# Patient Record
Sex: Female | Born: 1935 | Race: White | Hispanic: No | Smoking: Never smoker
Health system: Southern US, Community
[De-identification: ages and names within clinical notes are randomized; demographics above are authoritative.]

## PROBLEM LIST (undated history)

## (undated) DIAGNOSIS — H5440 Blindness, one eye, unspecified eye: Secondary | ICD-10-CM

## (undated) DIAGNOSIS — S72002A Fracture of unspecified part of neck of left femur, initial encounter for closed fracture: Secondary | ICD-10-CM

## (undated) DIAGNOSIS — N289 Disorder of kidney and ureter, unspecified: Secondary | ICD-10-CM

## (undated) DIAGNOSIS — E079 Disorder of thyroid, unspecified: Secondary | ICD-10-CM

## (undated) HISTORY — DX: Disorder of thyroid, unspecified: E07.9

---
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
## 2004-05-21 ENCOUNTER — Ambulatory Visit: Payer: Self-pay | Admitting: Family Medicine

## 2004-05-28 ENCOUNTER — Ambulatory Visit: Payer: Self-pay | Admitting: Family Medicine

## 2004-09-17 ENCOUNTER — Ambulatory Visit: Payer: Self-pay | Admitting: Family Medicine

## 2004-10-16 ENCOUNTER — Ambulatory Visit: Payer: Self-pay | Admitting: Family Medicine

## 2004-11-06 ENCOUNTER — Ambulatory Visit: Payer: Self-pay | Admitting: Cardiology

## 2005-03-04 ENCOUNTER — Ambulatory Visit: Payer: Self-pay | Admitting: Family Medicine

## 2005-03-11 ENCOUNTER — Ambulatory Visit: Payer: Self-pay | Admitting: Cardiology

## 2005-03-11 ENCOUNTER — Inpatient Hospital Stay (HOSPITAL_COMMUNITY): Admission: EM | Admit: 2005-03-11 | Discharge: 2005-03-12 | Payer: Self-pay | Admitting: Emergency Medicine

## 2005-03-11 ENCOUNTER — Encounter (INDEPENDENT_AMBULATORY_CARE_PROVIDER_SITE_OTHER): Payer: Self-pay | Admitting: *Deleted

## 2005-03-11 ENCOUNTER — Ambulatory Visit: Payer: Self-pay | Admitting: Family Medicine

## 2005-03-19 ENCOUNTER — Ambulatory Visit: Payer: Self-pay

## 2005-03-19 ENCOUNTER — Ambulatory Visit: Payer: Self-pay | Admitting: Internal Medicine

## 2005-04-03 ENCOUNTER — Ambulatory Visit: Payer: Self-pay | Admitting: *Deleted

## 2005-04-05 ENCOUNTER — Ambulatory Visit: Payer: Self-pay | Admitting: Critical Care Medicine

## 2005-07-29 ENCOUNTER — Ambulatory Visit: Payer: Self-pay | Admitting: Family Medicine

## 2005-08-06 ENCOUNTER — Ambulatory Visit: Payer: Self-pay | Admitting: Cardiology

## 2005-12-11 ENCOUNTER — Ambulatory Visit: Payer: Self-pay | Admitting: Family Medicine

## 2006-06-02 ENCOUNTER — Ambulatory Visit: Payer: Self-pay | Admitting: Family Medicine

## 2006-09-16 ENCOUNTER — Ambulatory Visit: Payer: Self-pay | Admitting: Family Medicine

## 2007-10-23 ENCOUNTER — Ambulatory Visit: Payer: Self-pay | Admitting: Cardiology

## 2010-04-15 ENCOUNTER — Encounter: Payer: Self-pay | Admitting: Critical Care Medicine

## 2010-08-10 NOTE — H&P (Signed)
[name], Fitzpatrick               ACCOUNT NO.:  [id number]   MEDICAL RECORD NO.:  [id number]          PATIENT TYPE:  INP   LOCATION:  1824                         FACILITY:  [facility]   PHYSICIAN:  Brandy Fitzpatrick, M.D.   DATE OF BIRTH:  [birth year]   DATE OF ADMISSION:  03/11/2005  DATE OF DISCHARGE:                                HISTORY & PHYSICAL   PRIMARY CARDIOLOGIST:  Brandy Fitzpatrick, M.D. ([place])   REASON FOR ADMISSION:  Brandy Fitzpatrick is a delightful 77 year old female, with  no prior cardiac history, now referred directly from her primary care  physician's office for evaluation of new onset chest pain.   The patient awoke at 5:30 this morning, as per usual, and has recently been  battling a sinus infection and, in fact, has been treated with steroids and  is currently on a 10-day course of antibiotics.  She felt quite congested  this morning and, after forcefully coughing up some phlegm, developed a  lower sternal chest discomfort which radiated to the mid scapular region.  She had some mild shortness of breath but no associated diaphoresis, nausea,  or radiation to the jaw or upper extremities.   After taking her morning medications, she presented to her primary care  physician for further evaluation.  Electrocardiogram suggested prior  inferior MI with an isolated PVC.  The patient was treated with aspirin and  transported here via EMS.   The patient reports exacerbation and other lower sternum discomfort with  either inspiration or palpation.  She otherwise denies any antecedent  history of exertional chest discomfort.  In fact, she is quite active and,  until recently, would rake her leaves without any associated chest  discomfort.  She denies any recent change in her baseline level of exercise  activity.   Electrocardiogram on admission reveals normal sinus rhythm at 62 beats per  minute with normal axis and insignificant Q waves in the inferolateral  leads.   ALLERGIES:   SULFA, CODEINE, and DARVOCET.   HOME MEDICATIONS:  1.  Synthroid 0.125 mg daily.  2.  Lanoxin 0.125 mg daily (since 1976).  3.  Amoxicillin 500 mg 3 times a day (day 8/10).  4.  Status post 5-day course of prednisone.   PAST MEDICAL HISTORY:  1.  Longstanding history of murmur.  2.  Treated hypothyroidism.  3.  Bilateral cataract surgery.   SOCIAL HISTORY:  The patient [place] in [place].  She lives alone and is  quite self sufficient, taking her own medications.  She freely attends to  her home and yard without the assistance of a walker or crutches.  She  worked at least 40 years in a [employer], [employer].  She has never smoked  tobacco and denies alcohol use.   FAMILY HISTORY:  Mother deceased at age 33 secondary to myocardial  infarction, history of stroke.  Father deceased age 75 from complications of  a stroke, history of prior MIs.  Brother deceased at age 18 from  complications of emphysema, history of MI.   REVIEW OF SYSTEMS:  Denies exertional chest pain.  Has mild exertional  dyspnea but no recent exacerbation.  Denies orthopnea, PND, lower extremity  edema, or symptoms of palpitations.  Denies any recent evidence of  hematuria, hematochezia, or melena.  Denies recent fever, chills, or sweats,  but has been battling a sinus infection and is currently on antibiotics.  She has diminished hearing on the right and does not wear an aid.  She has  no reflux symptoms.  She denies any prior history of myocardial infarction,  congestive heart failure, syncope, or stroke.  Remaining Review of Systems  negative.   PHYSICAL EXAMINATION:  VITAL SIGNS:  Blood pressure 115/72, pulse 58,  respiratory rate 20, temperature 98.2, O2 saturation 97% on room air.  GENERAL:  An 77 year old female in no apparent distress.  HEENT:  Normocephalic and atraumatic.  NECK:  Preserved carotid pulses without bruits; no JVD.  LUNGS:  Clear to auscultation in all fields.  HEART:  Regular rate and  rhythm (S1 and S2), a 3/6 holosystolic murmur heard  loudest in the left pre-axillary line (question __________), palpable apical  pulse, significant tenderness with sternal palpation.  ABDOMEN:  Soft, nontender, with intact bowel sounds.  EXTREMITIES:  Preserved bilateral femoral and distal pulses with no  significant pedal edema.   Admission chest x-ray: Cardiomegaly, COPD, no acute disease.   Electrocardiogram: Normal sinus rhythm at 62 beats per minute, normal axis;  no acute changes; significant Q wave in inferolateral leads.   LABORATORY DATA:  CPK 32/2.  Troponin I 0.02.  Digoxin 1.  Sodium 140,  potassium 4.4, BUN 9, creatinine 0.7, glucose 104.  Hemoglobin 14.7,  hematocrit 42.6, platelets 190, WBC 9.6 with elevated neutrophils at 83%.   IMPRESSION:  1.  Atypical chest pain.  Chest discomfort precipitated by vigorous coughing      and exacerbated by expiration or palpation of the xiphoid.  2.  Systolic murmur, suspect moderate mitral regurgitation; question mitral      valve prolapse.  3.  Treated hypothyroidism.  4.  Recent sinusitis treated with prednisone and currently on antibiotics.   PLAN:  1.  The patient will be admitted to telemetry for close monitoring and      further evaluation.  2.  Cardiac markers will be cycled to rule out an ischemic etiology,      although this appears unlikely by history and examination.  3.  Additionally, we will check a D-dimer to exclude pulmonary embolus as      the etiology for the pleuritic pain.  4.  A 2-D echocardiogram has been ordered for assessment of left ventricular      function and severity of the mitral regurgitation as well as exclusion      of any significant pericardial effusion.  5.  We will also check a fasting lipid profile, TSH, and hemoglobin A1c.  6.  If patient rules out for MI, plan is to proceed with outpatient stress     perfusion imaging for risk stratification.  7.  The patient's home medications will be  continued, and we will add      aspirin as well.      Brandy Fitzpatrick, P.A. [facility]      Brandy Fitzpatrick, M.D.  Electronically Signed    GS/MEDQ  D:  03/11/2005  T:  03/11/2005  Job:  [id number]   cc:   Delaney Meigs, M.D.  Fax: [phone]

## 2010-08-10 NOTE — Discharge Summary (Signed)
Brandy Fitzpatrick, Brandy Fitzpatrick               ACCOUNT NO.:  [id number]   MEDICAL RECORD NO.:  [id number]          PATIENT TYPE:  INP   LOCATION:  3733                         FACILITY:  [facility]   PHYSICIAN:  Charlton Haws, M.D.     DATE OF BIRTH:  03-14-1920   DATE OF ADMISSION:  03/11/2005  DATE OF DISCHARGE:  03/12/2005                                 DISCHARGE SUMMARY   PROCEDURE:  2-D echocardiogram.   PRINCIPAL DIAGNOSES:  1.  Atypical chest pain.      1.  Most likely musculoskeletal; normal serial cardiac markers.  2.  Valvular heart disease.      1.  Moderate mitral regurgitation/mild to moderate aortic regurgitation;          moderate MVP.  3.  Normal left ventricular function.  4.  Mildly elevated D-dimer.   SECONDARY DIAGNOSES:  1.  Treated hypothyroidism.  2.  Recent sinusitis.   REASON FOR ADMISSION:  Brandy Fitzpatrick is an 77 year old female with no prior  cardiac history, who was referred from her primary care physician's office  for further evaluation of new onset chest discomfort which was precipitated  by vigorous coughing. The patient is currently on antibiotics for recent  sinus infection. She presents with no prior history of exertional chest  pain.   LABORATORY DATA:  WBC of 9.6, hemoglobin 14.7, hematocrit 42.6, platelet  count 190,000. Neutrophils 83%, INR 1.1. D-dimer 0.58. Potassium 4.4,  glucose 104, BUN 9, creatinine 0.7, normal liver enzymes. Hemoglobin A1c of  5.8. Normal cardiac markers. TSH 0.97. Digoxin level 1.0.   ADMISSION CHEST X-RAY:  Cardiomegaly; COPD; no acute changes.   HOSPITAL COURSE:  The patient was admitted for overnight observation, rule  out of myocardial infarction. All serial cardiac markers were within normal  limits.   Additionally, a 2-D echocardiogram was ordered for assessment of LV function  and severity of valvular heart disease, by physical examination.  Echocardiogram revealed normal left ventricular function (EF of 55% to 65%)  with mild to moderate aortic regurgitation and moderate mitral regurgitation  with moderate MVP involving the posterior leaflet.   Of note, a D-dimer was ordered, although pulmonary embolus was thought to be  of low probability and this was mildly elevated at 0.58. Fasting lipids  pending.   The patient was cleared for discharge the following morning, with  instructions to proceed with further outpatient testing consisting of an  adenosine stress Myoview for risk stratification and a chest CT scan to rule  out pulmonary embolus.   The patient was instructed to use ibuprofen p.r.n. for her discomfort.   MEDICATIONS AT DISCHARGE:  1.  Coated aspirin 81 mg daily.  2.  Synthroid 0.125 mg daily.  3.  Lanoxin 0.125 mg daily.  4.  Ibuprofen 200 mg one to two tablets q.6h. p.r.n.   INSTRUCTIONS:  1.  Followup chest CT scan on Tuesday, December 26th at 10:30 a.m., rule out      pulmonary embolus.  2.  Followup adenosine stress Myoview on Tuesday, December 26th at 12 noon.  3.  Followup with Dr. Zenovia Jarred  clinic on January 10th at 10:45 a.m.   DISCHARGE DURATION:  38 minutes, including physician time.      Gene Serpe, P.A. [facility]    ______________________________  Charlton Haws, M.D.    GS/MEDQ  D:  03/12/2005  T:  03/13/2005  Job:  [id number]   cc:   Delaney Meigs, M.D.  Fax: [phone]

## 2015-03-26 DIAGNOSIS — H5440 Blindness, one eye, unspecified eye: Secondary | ICD-10-CM

## 2015-03-26 HISTORY — PX: JOINT REPLACEMENT: SHX530

## 2015-03-26 HISTORY — DX: Blindness, one eye, unspecified eye: H54.40

## 2015-05-16 DIAGNOSIS — E039 Hypothyroidism, unspecified: Secondary | ICD-10-CM | POA: Insufficient documentation

## 2015-05-16 DIAGNOSIS — S72002A Fracture of unspecified part of neck of left femur, initial encounter for closed fracture: Secondary | ICD-10-CM

## 2015-05-16 HISTORY — DX: Fracture of unspecified part of neck of left femur, initial encounter for closed fracture: S72.002A

## 2015-05-18 DIAGNOSIS — E538 Deficiency of other specified B group vitamins: Secondary | ICD-10-CM | POA: Insufficient documentation

## 2015-05-21 DIAGNOSIS — I340 Nonrheumatic mitral (valve) insufficiency: Secondary | ICD-10-CM | POA: Insufficient documentation

## 2015-08-25 ENCOUNTER — Encounter (INDEPENDENT_AMBULATORY_CARE_PROVIDER_SITE_OTHER): Payer: Medicare Other | Admitting: Ophthalmology

## 2015-08-25 DIAGNOSIS — H353122 Nonexudative age-related macular degeneration, left eye, intermediate dry stage: Secondary | ICD-10-CM

## 2015-08-25 DIAGNOSIS — I10 Essential (primary) hypertension: Secondary | ICD-10-CM

## 2015-08-25 DIAGNOSIS — H35033 Hypertensive retinopathy, bilateral: Secondary | ICD-10-CM

## 2015-08-25 DIAGNOSIS — H34811 Central retinal vein occlusion, right eye, with macular edema: Secondary | ICD-10-CM

## 2015-08-25 DIAGNOSIS — H43813 Vitreous degeneration, bilateral: Secondary | ICD-10-CM

## 2015-09-22 ENCOUNTER — Encounter (INDEPENDENT_AMBULATORY_CARE_PROVIDER_SITE_OTHER): Payer: Medicare Other | Admitting: Ophthalmology

## 2015-09-22 DIAGNOSIS — H35033 Hypertensive retinopathy, bilateral: Secondary | ICD-10-CM

## 2015-09-22 DIAGNOSIS — H34811 Central retinal vein occlusion, right eye, with macular edema: Secondary | ICD-10-CM

## 2015-09-22 DIAGNOSIS — H353122 Nonexudative age-related macular degeneration, left eye, intermediate dry stage: Secondary | ICD-10-CM

## 2015-09-22 DIAGNOSIS — H43813 Vitreous degeneration, bilateral: Secondary | ICD-10-CM

## 2015-09-22 DIAGNOSIS — I10 Essential (primary) hypertension: Secondary | ICD-10-CM

## 2015-10-20 ENCOUNTER — Encounter (INDEPENDENT_AMBULATORY_CARE_PROVIDER_SITE_OTHER): Payer: Medicare Other | Admitting: Ophthalmology

## 2015-10-20 DIAGNOSIS — H34811 Central retinal vein occlusion, right eye, with macular edema: Secondary | ICD-10-CM | POA: Diagnosis not present

## 2015-10-20 DIAGNOSIS — H353122 Nonexudative age-related macular degeneration, left eye, intermediate dry stage: Secondary | ICD-10-CM

## 2015-10-20 DIAGNOSIS — I10 Essential (primary) hypertension: Secondary | ICD-10-CM

## 2015-10-20 DIAGNOSIS — H43813 Vitreous degeneration, bilateral: Secondary | ICD-10-CM | POA: Diagnosis not present

## 2015-10-20 DIAGNOSIS — H35033 Hypertensive retinopathy, bilateral: Secondary | ICD-10-CM | POA: Diagnosis not present

## 2015-10-20 DIAGNOSIS — H35372 Puckering of macula, left eye: Secondary | ICD-10-CM

## 2015-11-20 ENCOUNTER — Encounter (INDEPENDENT_AMBULATORY_CARE_PROVIDER_SITE_OTHER): Payer: Medicare Other | Admitting: Ophthalmology

## 2015-11-20 DIAGNOSIS — H34811 Central retinal vein occlusion, right eye, with macular edema: Secondary | ICD-10-CM

## 2015-11-20 DIAGNOSIS — H353122 Nonexudative age-related macular degeneration, left eye, intermediate dry stage: Secondary | ICD-10-CM | POA: Diagnosis not present

## 2015-11-20 DIAGNOSIS — H43813 Vitreous degeneration, bilateral: Secondary | ICD-10-CM

## 2015-11-20 DIAGNOSIS — H35033 Hypertensive retinopathy, bilateral: Secondary | ICD-10-CM

## 2015-11-20 DIAGNOSIS — I10 Essential (primary) hypertension: Secondary | ICD-10-CM

## 2015-12-18 ENCOUNTER — Encounter (INDEPENDENT_AMBULATORY_CARE_PROVIDER_SITE_OTHER): Payer: Medicare Other | Admitting: Ophthalmology

## 2015-12-18 DIAGNOSIS — I10 Essential (primary) hypertension: Secondary | ICD-10-CM

## 2015-12-18 DIAGNOSIS — H43813 Vitreous degeneration, bilateral: Secondary | ICD-10-CM | POA: Diagnosis not present

## 2015-12-18 DIAGNOSIS — H353122 Nonexudative age-related macular degeneration, left eye, intermediate dry stage: Secondary | ICD-10-CM | POA: Diagnosis not present

## 2015-12-18 DIAGNOSIS — H34811 Central retinal vein occlusion, right eye, with macular edema: Secondary | ICD-10-CM

## 2015-12-18 DIAGNOSIS — H35033 Hypertensive retinopathy, bilateral: Secondary | ICD-10-CM | POA: Diagnosis not present

## 2016-06-17 ENCOUNTER — Ambulatory Visit (INDEPENDENT_AMBULATORY_CARE_PROVIDER_SITE_OTHER): Payer: Medicare Other | Admitting: Ophthalmology

## 2018-10-05 ENCOUNTER — Other Ambulatory Visit: Payer: Self-pay

## 2018-10-08 ENCOUNTER — Other Ambulatory Visit: Payer: Self-pay

## 2018-10-08 ENCOUNTER — Ambulatory Visit (INDEPENDENT_AMBULATORY_CARE_PROVIDER_SITE_OTHER): Payer: Medicare Other | Admitting: Family Medicine

## 2018-10-08 ENCOUNTER — Encounter: Payer: Self-pay | Admitting: Family Medicine

## 2018-10-08 VITALS — BP 134/79 | HR 72 | Temp 99.1°F | Ht 60.0 in | Wt 113.0 lb

## 2018-10-08 DIAGNOSIS — E039 Hypothyroidism, unspecified: Secondary | ICD-10-CM

## 2018-10-08 DIAGNOSIS — I340 Nonrheumatic mitral (valve) insufficiency: Secondary | ICD-10-CM

## 2018-10-08 DIAGNOSIS — Z7689 Persons encountering health services in other specified circumstances: Secondary | ICD-10-CM | POA: Diagnosis not present

## 2018-10-08 MED ORDER — LEVOTHYROXINE SODIUM 112 MCG PO TABS: 112.0000 ug | ORAL_TABLET | Freq: Every day | ORAL | 1 refills | Status: DC

## 2018-10-08 MED ORDER — DIGOXIN 125 MCG PO TABS: 0.1250 mg | ORAL_TABLET | Freq: Every day | ORAL | 1 refills | Status: DC

## 2018-10-08 NOTE — Progress Notes (Signed)
New Patient Office Visit  Subjective:  Patient ID: Brandy Fitzpatrick, female    DOB: [birth year]  Age: 85 y.o. MRN: [id number]  CC:  Chief Complaint  Patient presents with  . Establish Care    HPI Brandy Fitzpatrick presents to establish care and for medication refills. She is accompanied by her daughter who she is okay with being present. She is transferring from Dr. Murrell Redden office as they have recently closed due to his retirement.   Patient has no complaints today.  Review of Systems  Constitutional: Negative for chills, fever, malaise/fatigue and weight loss.  HENT: Positive for hearing loss. Negative for congestion, ear discharge, ear pain, nosebleeds, sinus pain, sore throat and tinnitus.   Eyes: Negative for blurred vision, double vision, pain, discharge and redness.       Blind in right eye.  Respiratory: Negative for cough, shortness of breath and wheezing.   Cardiovascular: Negative for chest pain, palpitations and leg swelling.  Gastrointestinal: Negative for abdominal pain, constipation, diarrhea, heartburn, nausea and vomiting.  Genitourinary: Negative for dysuria, frequency and urgency.  Musculoskeletal: Negative for myalgias.  Skin: Negative for rash.  Neurological: Negative for dizziness, seizures, weakness and headaches.  Psychiatric/Behavioral: Negative for depression, substance abuse and suicidal ideas. The patient is not nervous/anxious.     Current Outpatient Medications:  .  aspirin 81 MG chewable tablet, Chew by mouth daily., Disp: , Rfl:  .  digoxin (LANOXIN) 0.125 MG tablet, Take 1 tablet (0.125 mg total) by mouth daily., Disp: 90 tablet, Rfl: 1 .  levothyroxine (SYNTHROID) 112 MCG tablet, Take 1 tablet (112 mcg total) by mouth daily before breakfast., Disp: 90 tablet, Rfl: 1  Allergies  Allergen Reactions  . Codeine Rash  . Propoxyphene Rash  . Sulfa Antibiotics Rash    Past Medical History:  Diagnosis Date  . Blind right eye 2017   possible  stroke in eye; nerves burned so it would no longer cause pain  . Fracture of unspecified part of neck of left femur, initial encounter for closed fracture ([place]) 05/16/2015  . Thyroid disease     Past Surgical History:  Procedure Laterality Date  . JOINT REPLACEMENT  2017   hip    Family History  Problem Relation Age of Onset  . Stroke Mother   . Heart disease Mother   . Stroke Father   . Heart disease Father   . Parkinson's disease Sister   . Dementia Sister   . Stroke Sister   . Heart attack Brother     Social History   Socioeconomic History  . Marital status: Widowed    Spouse name: Not on file  . Number of children: Not on file  . Years of education: Not on file  . Highest education level: Not on file  Occupational History  . Not on file  Social Needs  . Financial resource strain: Not on file  . Food insecurity    Worry: Not on file    Inability: Not on file  . Transportation needs    Medical: Not on file    Non-medical: Not on file  Tobacco Use  . Smoking status: Never Smoker  . Smokeless tobacco: Never Used  Substance and Sexual Activity  . Alcohol use: Never    Frequency: Never  . Drug use: Never  . Sexual activity: Not Currently  Lifestyle  . Physical activity    Days per week: Not on file    Minutes per session: Not on file  .  Stress: Not on file  Relationships  . Social Herbalist on phone: Not on file    Gets together: Not on file    Attends religious service: Not on file    Active member of club or organization: Not on file    Attends meetings of clubs or organizations: Not on file    Relationship status: Not on file  . Intimate partner violence    Fear of current or ex partner: Not on file    Emotionally abused: Not on file    Physically abused: Not on file    Forced sexual activity: Not on file  Other Topics Concern  . Not on file  Social History Narrative  . Not on file    Objective:   Today's Vitals: BP 134/79   Pulse  72   Temp 99.1 F (37.3 C) (Oral)   Ht 5' (1.524 m)   Wt 113 lb (51.3 kg)   BMI 22.07 kg/m   Physical Exam Vitals signs reviewed.  Constitutional:      General: She is not in acute distress.    Appearance: Normal appearance. She is normal weight. She is not ill-appearing, toxic-appearing or diaphoretic.  HENT:     Head: Normocephalic and atraumatic.     Right Ear: Tympanic membrane, ear canal and external ear normal. There is no impacted cerumen.     Left Ear: Tympanic membrane, ear canal and external ear normal. There is no impacted cerumen.     Nose: Nose normal. No congestion or rhinorrhea.     Mouth/Throat:     Mouth: Mucous membranes are moist.     Pharynx: Oropharynx is clear. No oropharyngeal exudate or posterior oropharyngeal erythema.  Eyes:     General: No scleral icterus.       Right eye: No discharge.        Left eye: No discharge.     Conjunctiva/sclera: Conjunctivae normal.     Pupils: Pupils are equal, round, and reactive to light.  Neck:     Musculoskeletal: Normal range of motion and neck supple. No neck rigidity or muscular tenderness.  Cardiovascular:     Rate and Rhythm: Normal rate and regular rhythm.     Heart sounds: Murmur present. Systolic murmur present with a grade of 1/6. No friction rub. No gallop.   Pulmonary:     Effort: Pulmonary effort is normal. No respiratory distress.     Breath sounds: Normal breath sounds. No stridor. No wheezing, rhonchi or rales.  Abdominal:     General: Abdomen is flat. Bowel sounds are normal. There is no distension.     Palpations: Abdomen is soft. There is no mass.     Tenderness: There is no abdominal tenderness. There is no guarding or rebound.     Hernia: No hernia is present.  Musculoskeletal: Normal range of motion.  Lymphadenopathy:     Cervical: No cervical adenopathy.  Skin:    General: Skin is warm and dry.     Capillary Refill: Capillary refill takes less than 2 seconds.  Neurological:     General:  No focal deficit present.     Mental Status: She is alert and oriented to person, place, and time. Mental status is at baseline.     Gait: Gait abnormal (ambulates with cane).  Psychiatric:        Mood and Affect: Mood normal.        Behavior: Behavior normal.  Thought Content: Thought content normal.        Judgment: Judgment normal.     Assessment & Plan:   1. Acquired hypothyroidism - Well controlled on current regimen. - levothyroxine (SYNTHROID) 112 MCG tablet; Take 1 tablet (112 mcg total) by mouth daily before breakfast.  Dispense: 90 tablet; Refill: 1  2. Non-rheumatic mitral regurgitation - Controlled on current regimen.  - digoxin (LANOXIN) 0.125 MG tablet; Take 1 tablet (0.125 mg total) by mouth daily.  Dispense: 90 tablet; Refill: 1  3. Encounter to establish care - DEXA results requested from [facility].    Follow-up: Return in about 4 months (around 02/08/2019) for follow-up of chronic medication conditions.   Loman Brooklyn, FNP

## 2018-10-08 NOTE — Patient Instructions (Signed)
Preventive Care 83 Years and Older, Female Preventive care refers to lifestyle choices and visits with your health care provider that can promote health and wellness. This includes:  A yearly physical exam. This is also called an annual well check.  Regular dental and eye exams.  Immunizations.  Screening for certain conditions.  Healthy lifestyle choices, such as diet and exercise. What can I expect for my preventive care visit? Physical exam Your health care provider will check:  Height and weight. These may be used to calculate body mass index (BMI), which is a measurement that tells if you are at a healthy weight.  Heart rate and blood pressure.  Your skin for abnormal spots. Counseling Your health care provider may ask you questions about:  Alcohol, tobacco, and drug use.  Emotional well-being.  Home and relationship well-being.  Sexual activity.  Eating habits.  History of falls.  Memory and ability to understand (cognition).  Work and work Statistician.  Pregnancy and menstrual history. What immunizations do I need?  Influenza (flu) vaccine  This is recommended every year. Tetanus, diphtheria, and pertussis (Tdap) vaccine  You may need a Td booster every 10 years. Varicella (chickenpox) vaccine  You may need this vaccine if you have not already been vaccinated. Zoster (shingles) vaccine  You may need this after age 33. Pneumococcal conjugate (PCV13) vaccine  One dose is recommended after age 33. Pneumococcal polysaccharide (PPSV23) vaccine  One dose is recommended after age 72. Measles, mumps, and rubella (MMR) vaccine  You may need at least one dose of MMR if you were born in 1957 or later. You may also need a second dose. Meningococcal conjugate (MenACWY) vaccine  You may need this if you have certain conditions. Hepatitis A vaccine  You may need this if you have certain conditions or if you travel or work in places where you may be exposed  to hepatitis A. Hepatitis B vaccine  You may need this if you have certain conditions or if you travel or work in places where you may be exposed to hepatitis B. Haemophilus influenzae type b (Hib) vaccine  You may need this if you have certain conditions. You may receive vaccines as individual doses or as more than one vaccine together in one shot (combination vaccines). Talk with your health care provider about the risks and benefits of combination vaccines. What tests do I need? Blood tests  Lipid and cholesterol levels. These may be checked every 5 years, or more frequently depending on your overall health.  Hepatitis C test.  Hepatitis B test. Screening  Lung cancer screening. You may have this screening every year starting at age 39 if you have a 30-pack-year history of smoking and currently smoke or have quit within the past 15 years.  Colorectal cancer screening. All adults should have this screening starting at age 36 and continuing until age 15. Your health care provider may recommend screening at age 23 if you are at increased risk. You will have tests every 1-10 years, depending on your results and the type of screening test.  Diabetes screening. This is done by checking your blood sugar (glucose) after you have not eaten for a while (fasting). You may have this done every 1-3 years.  Mammogram. This may be done every 1-2 years. Talk with your health care provider about how often you should have regular mammograms.  BRCA-related cancer screening. This may be done if you have a family history of breast, ovarian, tubal, or peritoneal cancers.  Other tests  Sexually transmitted disease (STD) testing.  Bone density scan. This is done to screen for osteoporosis. You may have this done starting at age 76. Follow these instructions at home: Eating and drinking  Eat a diet that includes fresh fruits and vegetables, whole grains, lean protein, and low-fat dairy products. Limit  your intake of foods with high amounts of sugar, saturated fats, and salt.  Take vitamin and mineral supplements as recommended by your health care provider.  Do not drink alcohol if your health care provider tells you not to drink.  If you drink alcohol: ? Limit how much you have to 0-1 drink a day. ? Be aware of how much alcohol is in your drink. In the [place] one drink equals one 12 oz bottle of beer (355 mL), one 5 oz glass of wine (148 mL), or one 1 oz glass of hard liquor (44 mL). Lifestyle  Take daily care of your teeth and gums.  Stay active. Exercise for at least 30 minutes on 5 or more days each week.  Do not use any products that contain nicotine or tobacco, such as cigarettes, e-cigarettes, and chewing tobacco. If you need help quitting, ask your health care provider.  If you are sexually active, practice safe sex. Use a condom or other form of protection in order to prevent STIs (sexually transmitted infections).  Talk with your health care provider about taking a low-dose aspirin or statin. What's next?  Go to your health care provider once a year for a well check visit.  Ask your health care provider how often you should have your eyes and teeth checked.  Stay up to date on all vaccines. This information is not intended to replace advice given to you by your health care provider. Make sure you discuss any questions you have with your health care provider. Document Released: 04/07/2015 Document Revised: 03/05/2018 Document Reviewed: 03/05/2018 Elsevier Patient Education  2020 [employer].

## 2018-10-26 ENCOUNTER — Telehealth: Payer: Self-pay | Admitting: Family Medicine

## 2018-10-26 NOTE — Telephone Encounter (Signed)
Daughter just going to take her to an urgent care.

## 2018-11-12 ENCOUNTER — Encounter: Payer: Self-pay | Admitting: *Deleted

## 2018-12-09 ENCOUNTER — Ambulatory Visit: Payer: Medicare Other | Admitting: Family Medicine

## 2019-02-08 ENCOUNTER — Ambulatory Visit: Payer: Medicare Other | Admitting: Family Medicine

## 2019-02-09 ENCOUNTER — Encounter: Payer: Self-pay | Admitting: Family Medicine

## 2019-02-09 ENCOUNTER — Other Ambulatory Visit: Payer: Self-pay

## 2019-02-09 ENCOUNTER — Ambulatory Visit (INDEPENDENT_AMBULATORY_CARE_PROVIDER_SITE_OTHER): Payer: Medicare Other | Admitting: Family Medicine

## 2019-02-09 VITALS — BP 130/77 | HR 71 | Temp 97.7°F | Ht 60.0 in | Wt 113.0 lb

## 2019-02-09 DIAGNOSIS — L309 Dermatitis, unspecified: Secondary | ICD-10-CM

## 2019-02-09 DIAGNOSIS — Z9181 History of falling: Secondary | ICD-10-CM

## 2019-02-09 DIAGNOSIS — R310 Gross hematuria: Secondary | ICD-10-CM

## 2019-02-09 DIAGNOSIS — E039 Hypothyroidism, unspecified: Secondary | ICD-10-CM

## 2019-02-09 DIAGNOSIS — Z23 Encounter for immunization: Secondary | ICD-10-CM

## 2019-02-09 DIAGNOSIS — Z13228 Encounter for screening for other metabolic disorders: Secondary | ICD-10-CM

## 2019-02-09 DIAGNOSIS — H04123 Dry eye syndrome of bilateral lacrimal glands: Secondary | ICD-10-CM | POA: Diagnosis not present

## 2019-02-09 DIAGNOSIS — Z029 Encounter for administrative examinations, unspecified: Secondary | ICD-10-CM

## 2019-02-09 LAB — URINALYSIS, MICROSCOPIC ONLY
Bacteria, UA: NONE SEEN
Epithelial Cells (non renal): NONE SEEN /hpf (ref 0–10)
RBC, Urine: >30 /hpf — AB (ref 0–2)
Renal Epithel, UA: NONE SEEN /hpf
WBC, UA: NONE SEEN /hpf (ref 0–5)

## 2019-02-09 MED ORDER — TRIAMCINOLONE ACETONIDE 0.1 % EX CREA: 1.0000 "application " | TOPICAL_CREAM | Freq: Two times a day (BID) | CUTANEOUS | 1 refills | Status: DC

## 2019-02-09 MED ORDER — SYSTANE BALANCE 0.6 % OP SOLN: 1.0000 [drp] | Freq: Four times a day (QID) | OPHTHALMIC | 2 refills | Status: DC | PRN

## 2019-02-09 NOTE — Progress Notes (Signed)
Assessment & Plan:  1. Gross hematuria - Able to get patient an appointment today with urology. Discussed imaging needs to be completed but will let them order since she is seeing them today.  - Urine culture - urinalysis- microscopic only - Urine dipstick unable to run due to amount of blood in urine.  Micro exam: >30 RBC's per HPF. - Ambulatory referral to Urology - CBC with Differential/Platelet  2. History of recent fall - Patient is doing better. Has a lot of roots in her yard. Fall Assessment Review: Fall Risk Assessment was positive. Falls risk interventions taken today were: Discussed interventions at home e.g shower chairs, grab bars, no loose rugs, keeping floor free of obstacles, etc., Medications were reviewed, Discussed appropriate footwear  and Use of walking aids  3. Dry eyes - Propylene Glycol (SYSTANE BALANCE) 0.6 % SOLN; Apply 1-2 drops to eye 4 (four) times daily as needed.  Dispense: 15 mL; Refill: 2  4. Eczema, unspecified type - triamcinolone cream (KENALOG) 0.1 %; Apply 1 application topically 2 (two) times daily.  Dispense: 45 g; Refill: 1  5. Acquired hypothyroidism - Well controlled on current regimen.  - CMP14+EGFR - TSH  6. Screening for metabolic disorder - MWU13+KGMW  7. Need for immunization against influenza - Flu Vaccine QUAD High Dose(Fluad)   Return in about 3 months (around 05/12/2019) for follow-up of chronic medication conditions.  Hendricks Limes, MSN, APRN, FNP-C [facility]  Subjective:    Patient ID: Brandy Fitzpatrick, female    DOB: August 29, 1919, 85 y.o.   MRN: [id number]  Patient Care Team: Loman Brooklyn, FNP as PCP - General (Family Medicine)   Chief Complaint:  Chief Complaint  Patient presents with  . Hospitalization Follow-up    uti, fall  . Hematuria    HPI: DEMESHA Fitzpatrick is a 85 y.o. female presenting on 02/09/2019 for Hospitalization Follow-up (uti, fall) and Hematuria  Patient reports  she is "bleeding like somebody with a period again". She is passing blood clots that she reports are as large as the end of her pinky finger. She has had numerous episodes of hematuria this year, which she reports clear with antibiotics. She was most recently treated with Macrobid at [facility] on 01/18/2019. She was treated with Keflex three months ago. She was seen in January (10 months ago) at which point she was not treated but advised to f/u with PCP. At that time she was going to a [place] office; it does not appear this was mentioned to the PCP through chart review. No imaging has been completed.   Patient went to ER last month due to a fall with difficulty walking. She was afraid she re-broke her hip. Imaging was negative. She was given Ibuprofen for pain which has improved. She had sustained a skin tear to the left upper arm which has healed.   Patient c/o dry eyes.   Social history:  Relevant past medical, surgical, family and social history reviewed and updated as indicated. Interim medical history since our last visit reviewed.  Allergies and medications reviewed and updated.  DATA REVIEWED: CHART IN EPIC  ROS: Negative unless specifically indicated above in HPI.    Current Outpatient Medications:  .  aspirin 81 MG chewable tablet, Chew by mouth daily., Disp: , Rfl:  .  digoxin (LANOXIN) 0.125 MG tablet, Take 1 tablet (0.125 mg total) by mouth daily., Disp: 90 tablet, Rfl: 1 .  levothyroxine (SYNTHROID) 112 MCG tablet, Take 1  tablet (112 mcg total) by mouth daily before breakfast., Disp: 90 tablet, Rfl: 1 .  Propylene Glycol (SYSTANE BALANCE) 0.6 % SOLN, Apply 1-2 drops to eye 4 (four) times daily as needed., Disp: 15 mL, Rfl: 2 .  triamcinolone cream (KENALOG) 0.1 %, Apply 1 application topically 2 (two) times daily., Disp: 45 g, Rfl: 1   Allergies  Allergen Reactions  . Codeine Rash  . Propoxyphene Rash  . Sulfa Antibiotics Rash   Past Medical History:  Diagnosis Date   . Blind right eye 2017   possible stroke in eye; nerves burned so it would no longer cause pain  . Fracture of unspecified part of neck of left femur, initial encounter for closed fracture ([place]) 05/16/2015  . Thyroid disease     Past Surgical History:  Procedure Laterality Date  . JOINT REPLACEMENT  2017   hip    Social History   Socioeconomic History  . Marital status: Widowed    Spouse name: Not on file  . Number of children: Not on file  . Years of education: Not on file  . Highest education level: Not on file  Occupational History  . Not on file  Social Needs  . Financial resource strain: Not on file  . Food insecurity    Worry: Not on file    Inability: Not on file  . Transportation needs    Medical: Not on file    Non-medical: Not on file  Tobacco Use  . Smoking status: Never Smoker  . Smokeless tobacco: Never Used  Substance and Sexual Activity  . Alcohol use: Never    Frequency: Never  . Drug use: Never  . Sexual activity: Not Currently  Lifestyle  . Physical activity    Days per week: Not on file    Minutes per session: Not on file  . Stress: Not on file  Relationships  . Social Herbalist on phone: Not on file    Gets together: Not on file    Attends religious service: Not on file    Active member of club or organization: Not on file    Attends meetings of clubs or organizations: Not on file    Relationship status: Not on file  . Intimate partner violence    Fear of current or ex partner: Not on file    Emotionally abused: Not on file    Physically abused: Not on file    Forced sexual activity: Not on file  Other Topics Concern  . Not on file  Social History Narrative  . Not on file        Objective:    BP 130/77   Pulse 71   Temp 97.7 F (36.5 C) (Temporal)   Ht 5' (1.524 m)   Wt 113 lb (51.3 kg)   SpO2 99%   BMI 22.07 kg/m   Physical Exam Vitals signs reviewed.  Constitutional:      General: She is not in acute  distress.    Appearance: Normal appearance. She is not ill-appearing, toxic-appearing or diaphoretic.  HENT:     Head: Normocephalic and atraumatic.  Eyes:     General: No scleral icterus.       Right eye: No discharge.        Left eye: No discharge.     Conjunctiva/sclera: Conjunctivae normal.  Neck:     Musculoskeletal: Normal range of motion.  Cardiovascular:     Rate and Rhythm: Normal rate and regular  rhythm.     Heart sounds: Murmur present. Systolic murmur present with a grade of 1/6. No friction rub. No gallop.   Pulmonary:     Effort: Pulmonary effort is normal. No respiratory distress.     Breath sounds: Normal breath sounds. No stridor. No wheezing, rhonchi or rales.  Genitourinary:    Comments: Urine visualized when specimen was collected and is dark red.  Musculoskeletal: Normal range of motion.  Skin:    General: Skin is warm and dry.     Capillary Refill: Capillary refill takes less than 2 seconds.     Comments: Patient has two erythematous/flaky patches of skin - one to her right wright and the other to her right shin. She does report these itch "like the dickens".  Skin tear to LUE has healed well.  Small scab to LUE where patient accidentally scratched herself.   Neurological:     General: No focal deficit present.     Mental Status: She is alert and oriented to person, place, and time. Mental status is at baseline.     Gait: Gait abnormal (ambulates with cane).  Psychiatric:        Mood and Affect: Mood normal.        Behavior: Behavior normal.        Thought Content: Thought content normal.        Judgment: Judgment normal.

## 2019-02-10 ENCOUNTER — Other Ambulatory Visit (HOSPITAL_COMMUNITY): Payer: Self-pay | Admitting: Urology

## 2019-02-10 ENCOUNTER — Other Ambulatory Visit: Payer: Self-pay | Admitting: Urology

## 2019-02-10 DIAGNOSIS — R310 Gross hematuria: Secondary | ICD-10-CM

## 2019-02-10 LAB — CBC WITH DIFFERENTIAL/PLATELET
Basophils Absolute: 0.1 10*3/uL (ref 0.0–0.2)
Basos: 1 %
EOS (ABSOLUTE): 0.1 10*3/uL (ref 0.0–0.4)
Eos: 2 %
Hematocrit: 35.4 % (ref 34.0–46.6)
Hemoglobin: 11.5 g/dL (ref 11.1–15.9)
Immature Grans (Abs): 0 10*3/uL (ref 0.0–0.1)
Immature Granulocytes: 0 %
Lymphocytes Absolute: 1.2 10*3/uL (ref 0.7–3.1)
Lymphs: 23 %
MCH: 28 pg (ref 26.6–33.0)
MCHC: 32.5 g/dL (ref 31.5–35.7)
MCV: 86 fL (ref 79–97)
Monocytes Absolute: 0.4 10*3/uL (ref 0.1–0.9)
Monocytes: 8 %
Neutrophils Absolute: 3.4 10*3/uL (ref 1.4–7.0)
Neutrophils: 66 %
Platelets: 212 10*3/uL (ref 150–450)
RBC: 4.1 x10E6/uL (ref 3.77–5.28)
RDW: 14.3 % (ref 11.7–15.4)
WBC: 5.2 10*3/uL (ref 3.4–10.8)

## 2019-02-10 LAB — CMP14+EGFR
ALT: 12 IU/L (ref 0–32)
AST: 20 IU/L (ref 0–40)
Albumin/Globulin Ratio: 1.1 — ABNORMAL LOW (ref 1.2–2.2)
Albumin: 3.6 g/dL (ref 3.5–4.6)
Alkaline Phosphatase: 159 IU/L — ABNORMAL HIGH (ref 39–117)
BUN/Creatinine Ratio: 22 (ref 12–28)
BUN: 18 mg/dL (ref 10–36)
Bilirubin Total: 0.6 mg/dL (ref 0.0–1.2)
CO2: 22 mmol/L (ref 20–29)
Calcium: 10.1 mg/dL (ref 8.7–10.3)
Chloride: 104 mmol/L (ref 96–106)
Creatinine, Ser: 0.82 mg/dL (ref 0.57–1.00)
GFR calc Af Amer: 70 mL/min/{1.73_m2} (ref >59)
GFR calc non Af Amer: 61 mL/min/{1.73_m2} (ref >59)
Globulin, Total: 3.3 g/dL (ref 1.5–4.5)
Glucose: 87 mg/dL (ref 65–99)
Potassium: 4.3 mmol/L (ref 3.5–5.2)
Sodium: 140 mmol/L (ref 134–144)
Total Protein: 6.9 g/dL (ref 6.0–8.5)

## 2019-02-10 LAB — TSH: TSH: 1.33 u[IU]/mL (ref 0.450–4.500)

## 2019-02-10 LAB — URINE CULTURE

## 2019-02-11 ENCOUNTER — Other Ambulatory Visit: Payer: Self-pay

## 2019-02-11 DIAGNOSIS — Z20822 Contact with and (suspected) exposure to covid-19: Secondary | ICD-10-CM

## 2019-02-13 LAB — NOVEL CORONAVIRUS, NAA: SARS-CoV-2, NAA: NOT DETECTED

## 2019-02-15 ENCOUNTER — Telehealth: Payer: Self-pay | Admitting: General Practice

## 2019-02-15 NOTE — Telephone Encounter (Signed)
Gave daughter Marlowe Kays, patients negative covid test results Patients daughter understood

## 2019-03-01 ENCOUNTER — Inpatient Hospital Stay (HOSPITAL_COMMUNITY)
Admission: EM | Admit: 2019-03-01 | Discharge: 2019-03-04 | DRG: 687 | Disposition: A | Discharge status: Home Health | Payer: Medicare Other | Attending: Internal Medicine | Admitting: Internal Medicine

## 2019-03-01 ENCOUNTER — Encounter (HOSPITAL_COMMUNITY): Payer: Self-pay

## 2019-03-01 ENCOUNTER — Other Ambulatory Visit: Payer: Self-pay

## 2019-03-01 ENCOUNTER — Emergency Department (HOSPITAL_COMMUNITY): Payer: Medicare Other

## 2019-03-01 DIAGNOSIS — Z79899 Other long term (current) drug therapy: Secondary | ICD-10-CM

## 2019-03-01 DIAGNOSIS — E039 Hypothyroidism, unspecified: Secondary | ICD-10-CM | POA: Diagnosis present

## 2019-03-01 DIAGNOSIS — W19XXXA Unspecified fall, initial encounter: Secondary | ICD-10-CM | POA: Diagnosis present

## 2019-03-01 DIAGNOSIS — Z20828 Contact with and (suspected) exposure to other viral communicable diseases: Secondary | ICD-10-CM | POA: Diagnosis present

## 2019-03-01 DIAGNOSIS — H5461 Unqualified visual loss, right eye, normal vision left eye: Secondary | ICD-10-CM | POA: Diagnosis present

## 2019-03-01 DIAGNOSIS — N029 Recurrent and persistent hematuria with unspecified morphologic changes: Secondary | ICD-10-CM | POA: Diagnosis present

## 2019-03-01 DIAGNOSIS — Z820 Family history of epilepsy and other diseases of the nervous system: Secondary | ICD-10-CM

## 2019-03-01 DIAGNOSIS — Z823 Family history of stroke: Secondary | ICD-10-CM

## 2019-03-01 DIAGNOSIS — C679 Malignant neoplasm of bladder, unspecified: Principal | ICD-10-CM | POA: Diagnosis present

## 2019-03-01 DIAGNOSIS — N3091 Cystitis, unspecified with hematuria: Secondary | ICD-10-CM

## 2019-03-01 DIAGNOSIS — I480 Paroxysmal atrial fibrillation: Secondary | ICD-10-CM | POA: Diagnosis present

## 2019-03-01 DIAGNOSIS — Z7982 Long term (current) use of aspirin: Secondary | ICD-10-CM

## 2019-03-01 DIAGNOSIS — R319 Hematuria, unspecified: Secondary | ICD-10-CM

## 2019-03-01 DIAGNOSIS — I340 Nonrheumatic mitral (valve) insufficiency: Secondary | ICD-10-CM | POA: Diagnosis present

## 2019-03-01 DIAGNOSIS — S82201A Unspecified fracture of shaft of right tibia, initial encounter for closed fracture: Secondary | ICD-10-CM | POA: Diagnosis present

## 2019-03-01 DIAGNOSIS — N3289 Other specified disorders of bladder: Secondary | ICD-10-CM

## 2019-03-01 DIAGNOSIS — Z8249 Family history of ischemic heart disease and other diseases of the circulatory system: Secondary | ICD-10-CM

## 2019-03-01 DIAGNOSIS — B9561 Methicillin susceptible Staphylococcus aureus infection as the cause of diseases classified elsewhere: Secondary | ICD-10-CM | POA: Diagnosis present

## 2019-03-01 DIAGNOSIS — R310 Gross hematuria: Secondary | ICD-10-CM

## 2019-03-01 DIAGNOSIS — Z882 Allergy status to sulfonamides status: Secondary | ICD-10-CM

## 2019-03-01 DIAGNOSIS — K219 Gastro-esophageal reflux disease without esophagitis: Secondary | ICD-10-CM | POA: Diagnosis present

## 2019-03-01 DIAGNOSIS — N390 Urinary tract infection, site not specified: Secondary | ICD-10-CM | POA: Diagnosis present

## 2019-03-01 DIAGNOSIS — Z96642 Presence of left artificial hip joint: Secondary | ICD-10-CM | POA: Diagnosis present

## 2019-03-01 DIAGNOSIS — H9190 Unspecified hearing loss, unspecified ear: Secondary | ICD-10-CM | POA: Diagnosis present

## 2019-03-01 DIAGNOSIS — M810 Age-related osteoporosis without current pathological fracture: Secondary | ICD-10-CM | POA: Diagnosis present

## 2019-03-01 DIAGNOSIS — X58XXXA Exposure to other specified factors, initial encounter: Secondary | ICD-10-CM | POA: Diagnosis present

## 2019-03-01 DIAGNOSIS — D62 Acute posthemorrhagic anemia: Secondary | ICD-10-CM

## 2019-03-01 DIAGNOSIS — Z66 Do not resuscitate: Secondary | ICD-10-CM | POA: Diagnosis present

## 2019-03-01 DIAGNOSIS — Z79890 Hormone replacement therapy (postmenopausal): Secondary | ICD-10-CM | POA: Diagnosis not present

## 2019-03-01 DIAGNOSIS — Z885 Allergy status to narcotic agent status: Secondary | ICD-10-CM | POA: Diagnosis not present

## 2019-03-01 DIAGNOSIS — C678 Malignant neoplasm of overlapping sites of bladder: Secondary | ICD-10-CM | POA: Diagnosis not present

## 2019-03-01 DIAGNOSIS — E538 Deficiency of other specified B group vitamins: Secondary | ICD-10-CM | POA: Diagnosis present

## 2019-03-01 LAB — CBC WITH DIFFERENTIAL/PLATELET
Abs Immature Granulocytes: 0.01 10*3/uL (ref 0.00–0.07)
Basophils Absolute: 0 10*3/uL (ref 0.0–0.1)
Basophils Relative: 1 %
Eosinophils Absolute: 0.1 10*3/uL (ref 0.0–0.5)
Eosinophils Relative: 3 %
HCT: 36 % (ref 36.0–46.0)
Hemoglobin: 10.8 g/dL — ABNORMAL LOW (ref 12.0–15.0)
Immature Granulocytes: 0 %
Lymphocytes Relative: 22 %
Lymphs Abs: 1.1 10*3/uL (ref 0.7–4.0)
MCH: 26.7 pg (ref 26.0–34.0)
MCHC: 30 g/dL (ref 30.0–36.0)
MCV: 88.9 fL (ref 80.0–100.0)
Monocytes Absolute: 0.5 10*3/uL (ref 0.1–1.0)
Monocytes Relative: 9 %
Neutro Abs: 3.2 10*3/uL (ref 1.7–7.7)
Neutrophils Relative %: 65 %
Platelets: 187 10*3/uL (ref 150–400)
RBC: 4.05 MIL/uL (ref 3.87–5.11)
RDW: 14.5 % (ref 11.5–15.5)
WBC: 5 10*3/uL (ref 4.0–10.5)
nRBC: 0 % (ref 0.0–0.2)

## 2019-03-01 LAB — COMPREHENSIVE METABOLIC PANEL
ALT: 15 U/L (ref 0–44)
AST: 24 U/L (ref 15–41)
Albumin: 3.6 g/dL (ref 3.5–5.0)
Alkaline Phosphatase: 96 U/L (ref 38–126)
Anion gap: 8 (ref 5–15)
BUN: 21 mg/dL (ref 8–23)
CO2: 24 mmol/L (ref 22–32)
Calcium: 9.8 mg/dL (ref 8.9–10.3)
Chloride: 106 mmol/L (ref 98–111)
Creatinine, Ser: 0.74 mg/dL (ref 0.44–1.00)
GFR calc Af Amer: >60 mL/min (ref >60)
GFR calc non Af Amer: >60 mL/min (ref >60)
Glucose, Bld: 110 mg/dL — ABNORMAL HIGH (ref 70–99)
Potassium: 4.3 mmol/L (ref 3.5–5.1)
Sodium: 138 mmol/L (ref 135–145)
Total Bilirubin: 0.7 mg/dL (ref 0.3–1.2)
Total Protein: 6.8 g/dL (ref 6.5–8.1)

## 2019-03-01 LAB — URINALYSIS, MICROSCOPIC (REFLEX): RBC / HPF: >50 RBC/hpf (ref 0–5)

## 2019-03-01 LAB — URINALYSIS, ROUTINE W REFLEX MICROSCOPIC

## 2019-03-01 MED ORDER — BISACODYL 10 MG RE SUPP: 10.0000 mg | Freq: Every day | RECTAL | Status: DC | PRN

## 2019-03-01 MED ORDER — DIGOXIN 125 MCG PO TABS
0.1250 mg | ORAL_TABLET | Freq: Every day | ORAL | Status: DC
  Administered 2019-03-03 – 2019-03-04 (×2): 0.125 mg via ORAL
  Filled 2019-03-01 (×3): qty 1

## 2019-03-01 MED ORDER — SODIUM CHLORIDE 0.9% FLUSH: 3.0000 mL | INTRAVENOUS | Status: DC | PRN

## 2019-03-01 MED ORDER — SODIUM CHLORIDE 0.9 % IV BOLUS
500.0000 mL | Freq: Once | INTRAVENOUS | Status: AC
  Administered 2019-03-01: 500 mL via INTRAVENOUS

## 2019-03-01 MED ORDER — LEVOTHYROXINE SODIUM 112 MCG PO TABS
112.0000 ug | ORAL_TABLET | Freq: Every day | ORAL | Status: DC
  Administered 2019-03-02 – 2019-03-04 (×3): 112 ug via ORAL
  Filled 2019-03-01 (×3): qty 1

## 2019-03-01 MED ORDER — IOHEXOL 300 MG/ML  SOLN
100.0000 mL | Freq: Once | INTRAMUSCULAR | Status: AC | PRN
  Administered 2019-03-01: 75 mL via INTRAVENOUS

## 2019-03-01 MED ORDER — TRAMADOL HCL 50 MG PO TABS
50.0000 mg | ORAL_TABLET | Freq: Three times a day (TID) | ORAL | Status: DC | PRN
  Administered 2019-03-03: 50 mg via ORAL
  Filled 2019-03-01: qty 1

## 2019-03-01 MED ORDER — ONDANSETRON HCL 4 MG/2ML IJ SOLN
4.0000 mg | Freq: Four times a day (QID) | INTRAMUSCULAR | Status: DC | PRN
  Administered 2019-03-03: 4 mg via INTRAVENOUS
  Filled 2019-03-01: qty 2

## 2019-03-01 MED ORDER — CEPHALEXIN 500 MG PO CAPS: 500.0000 mg | ORAL_CAPSULE | Freq: Once | ORAL | Status: DC

## 2019-03-01 MED ORDER — SODIUM CHLORIDE 0.9 % IV SOLN: 250.0000 mL | INTRAVENOUS | Status: DC | PRN

## 2019-03-01 MED ORDER — POLYETHYLENE GLYCOL 3350 17 G PO PACK: 17.0000 g | PACK | Freq: Every day | ORAL | Status: DC | PRN

## 2019-03-01 MED ORDER — SODIUM CHLORIDE 0.9 % IV SOLN
1.0000 g | Freq: Once | INTRAVENOUS | Status: AC
  Administered 2019-03-01: 1 g via INTRAVENOUS
  Filled 2019-03-01: qty 10

## 2019-03-01 MED ORDER — ONDANSETRON HCL 4 MG PO TABS: 4.0000 mg | ORAL_TABLET | Freq: Four times a day (QID) | ORAL | Status: DC | PRN

## 2019-03-01 MED ORDER — ACETAMINOPHEN 325 MG PO TABS: 650.0000 mg | ORAL_TABLET | Freq: Four times a day (QID) | ORAL | Status: DC | PRN

## 2019-03-01 MED ORDER — SODIUM CHLORIDE 0.9% FLUSH
3.0000 mL | Freq: Two times a day (BID) | INTRAVENOUS | Status: DC
  Administered 2019-03-02: 3 mL via INTRAVENOUS

## 2019-03-01 MED ORDER — LACTATED RINGERS IV SOLN
INTRAVENOUS | Status: DC
  Administered 2019-03-02 – 2019-03-03 (×2): via INTRAVENOUS

## 2019-03-01 MED ORDER — ACETAMINOPHEN 650 MG RE SUPP: 650.0000 mg | Freq: Four times a day (QID) | RECTAL | Status: DC | PRN

## 2019-03-01 NOTE — ED Triage Notes (Signed)
Pt presents to ED with complaints on going hematuria. Daughter states she has been passing clots and has been gotten weaker. Pt is scheduled for CT scan tomorrow.

## 2019-03-01 NOTE — H&P (Signed)
History and Physical    Brandy Fitzpatrick [id number] DOB: 22-Dec-1919 DOA: 03/01/2019  PCP: Loman Brooklyn, FNP  Patient coming from: Home  I have personally briefly reviewed patient's old medical records in [place]  Chief Complaint: Hematuria  HPI: Brandy Fitzpatrick is a 85 y.o. female with medical history significant of hypothyroidism, B12 deficiency who presented to the ER with gross hematuria.  Patient usually lives alone and is independent.  Has been having hematuria for the last several months.  Patient has been having intermittent hematuria in the past.  She has had UTI with MSSA and staph epidermidis in the past treated with antibiotics.  Patient was planned for a CT of the abdomen tomorrow due to persistent hematuria.  She came into the ER today as her symptoms had gotten worse and she noticed significantly increased bright red blood per urine with clots. ED Course:  Vital Signs reviewed on presentation, significant for temperature 99.4, blood pressure 140/110, saturation 95% on room air, heart rate 80. Labs reviewed, significant for sodium 138, potassium 4.3, chloride 106, BUN 21, creatinine 0.74, LFTs within normal limits, WBC count 5.0, hemoglobin 10.8, hematocrit 36, platelets 187.  MCV is 88.  Urinalysis shows gross hematuria with many bacteria, 6-10 WBCs.  Urine culture has been sent. Imaging personally Reviewed, right knee x-ray shows questionable avulsion fracture of the lateral tibial spine.  Diffuse demineralization.  There is tricompartmental degenerative changes with chondrocalcinosis.  CT of the abdomen pelvis shows multifocal bladder masses suspicious for primary bladder neoplasm.  There is a large left inferolateral bladder mass measuring 6.4 x 3.9 cm with an additional 1.2 x 2.8 cm lesion along the right anterior bladder wall.   Review of Systems: As per HPI otherwise 10 point review of systems negative.  All other review of systems is negative except the ones  noted above in the HPI.  Past Medical History:  Diagnosis Date  . Blind right eye 2017   possible stroke in eye; nerves burned so it would no longer cause pain  . Fracture of unspecified part of neck of left femur, initial encounter for closed fracture ([place]) 05/16/2015  . Thyroid disease     Past Surgical History:  Procedure Laterality Date  . JOINT REPLACEMENT  2017   hip     reports that she has never smoked. She has never used smokeless tobacco. She reports that she does not drink alcohol or use drugs.  Allergies  Allergen Reactions  . Codeine Rash  . Propoxyphene Rash  . Sulfa Antibiotics Rash    Family History  Problem Relation Age of Onset  . Stroke Mother   . Heart disease Mother   . Stroke Father   . Heart disease Father   . Parkinson's disease Sister   . Dementia Sister   . Stroke Sister   . Heart attack Brother    Family history reviewed, noted as above, not pertinent to current presentation.   Prior to Admission medications   Medication Sig Start Date End Date Taking? Authorizing Provider  aspirin 81 MG chewable tablet Chew by mouth daily.    [provider]  digoxin (LANOXIN) 0.125 MG tablet Take 1 tablet (0.125 mg total) by mouth daily. 10/08/18   Loman Brooklyn, FNP  levothyroxine (SYNTHROID) 112 MCG tablet Take 1 tablet (112 mcg total) by mouth daily before breakfast. 10/08/18   Loman Brooklyn, FNP  Propylene Glycol (SYSTANE BALANCE) 0.6 % SOLN Apply 1-2 drops to eye 4 (  four) times daily as needed. 02/09/19   Loman Brooklyn, FNP  triamcinolone cream (KENALOG) 0.1 % Apply 1 application topically 2 (two) times daily. 02/09/19   Loman Brooklyn, FNP    Physical Exam: Vitals:   03/01/19 1305 03/01/19 2001 03/01/19 2002  BP: (!) 140/119 128/65   Pulse: 80 68 68  Resp: 18 17   Temp: 99.4 F (37.4 C) 97.8 F (36.6 C)   TempSrc: Oral Oral   SpO2: 95% 97% 96%  Weight: 51.3 kg    Height: 5' (1.524 m)      Constitutional: NAD, calm,  comfortable Vitals:   03/01/19 1305 03/01/19 2001 03/01/19 2002  BP: (!) 140/119 128/65   Pulse: 80 68 68  Resp: 18 17   Temp: 99.4 F (37.4 C) 97.8 F (36.6 C)   TempSrc: Oral Oral   SpO2: 95% 97% 96%  Weight: 51.3 kg    Height: 5' (1.524 m)     Eyes: PERRL, lids and conjunctivae normal ENMT: Mucous membranes are moist. Posterior pharynx clear of any exudate or lesions.Normal dentition.  Neck: normal, supple, no masses, no thyromegaly Respiratory: clear to auscultation bilaterally, no wheezing, no crackles. Normal respiratory effort. No accessory muscle use.  Cardiovascular: Regular rate and rhythm, no murmurs / rubs / gallops. No extremity edema. 2+ pedal pulses. No carotid bruits.  Abdomen: no tenderness, no masses palpated. No hepatosplenomegaly. Bowel sounds positive.  Musculoskeletal: no clubbing / cyanosis. No joint deformity upper and lower extremities. Good ROM, no contractures. Normal muscle tone.  Skin: no rashes, lesions, ulcers. No induration Neurologic: CN 2-12 grossly intact. Sensation intact, DTR normal. Strength 5/5 in all 4.  Psychiatric: Normal judgment and insight. Alert and oriented x 3. Normal mood.    Decubitus Ulcers: Not present on admission Catheters and tubes: None   Labs on Admission: I have personally reviewed following labs and imaging studies  CBC: Recent Labs  Lab 03/01/19 1453  WBC 5.0  NEUTROABS 3.2  HGB 10.8*  HCT 36.0  MCV 88.9  PLT 123XX123   Basic Metabolic Panel: Recent Labs  Lab 03/01/19 1453  NA 138  K 4.3  CL 106  CO2 24  GLUCOSE 110*  BUN 21  CREATININE 0.74  CALCIUM 9.8   GFR: Estimated Creatinine Clearance: 29.5 mL/min (by C-G formula based on SCr of 0.74 mg/dL). Liver Function Tests: Recent Labs  Lab 03/01/19 1453  AST 24  ALT 15  ALKPHOS 96  BILITOT 0.7  PROT 6.8  ALBUMIN 3.6   No results for input(s): LIPASE, AMYLASE in the last 168 hours. No results for input(s): AMMONIA in the last 168 hours.  Coagulation Profile: No results for input(s): INR, PROTIME in the last 168 hours. Cardiac Enzymes: No results for input(s): CKTOTAL, CKMB, CKMBINDEX, TROPONINI in the last 168 hours. BNP (last 3 results) No results for input(s): PROBNP in the last 8760 hours. HbA1C: No results for input(s): HGBA1C in the last 72 hours. CBG: No results for input(s): GLUCAP in the last 168 hours. Lipid Profile: No results for input(s): CHOL, HDL, LDLCALC, TRIG, CHOLHDL, LDLDIRECT in the last 72 hours. Thyroid Function Tests: No results for input(s): TSH, T4TOTAL, FREET4, T3FREE, THYROIDAB in the last 72 hours. Anemia Panel: No results for input(s): VITAMINB12, FOLATE, FERRITIN, TIBC, IRON, RETICCTPCT in the last 72 hours. Urine analysis:    Component Value Date/Time   COLORURINE RED (A) 03/01/2019 1309   APPEARANCEUR TURBID (A) 03/01/2019 1309   LABSPEC  03/01/2019 1309  TEST NOT REPORTED DUE TO COLOR INTERFERENCE OF URINE PIGMENT   PHURINE  03/01/2019 1309    TEST NOT REPORTED DUE TO COLOR INTERFERENCE OF URINE PIGMENT   GLUCOSEU (A) 03/01/2019 1309    TEST NOT REPORTED DUE TO COLOR INTERFERENCE OF URINE PIGMENT   HGBUR (A) 03/01/2019 1309    TEST NOT REPORTED DUE TO COLOR INTERFERENCE OF URINE PIGMENT   BILIRUBINUR (A) 03/01/2019 1309    TEST NOT REPORTED DUE TO COLOR INTERFERENCE OF URINE PIGMENT   KETONESUR (A) 03/01/2019 1309    TEST NOT REPORTED DUE TO COLOR INTERFERENCE OF URINE PIGMENT   PROTEINUR (A) 03/01/2019 1309    TEST NOT REPORTED DUE TO COLOR INTERFERENCE OF URINE PIGMENT   NITRITE (A) 03/01/2019 1309    TEST NOT REPORTED DUE TO COLOR INTERFERENCE OF URINE PIGMENT   LEUKOCYTESUR (A) 03/01/2019 1309    TEST NOT REPORTED DUE TO COLOR INTERFERENCE OF URINE PIGMENT    Radiological Exams on Admission: Ct Abdomen Pelvis W Contrast  Result Date: 03/01/2019 CLINICAL DATA:  Hematuria, abdominal distension EXAM: CT ABDOMEN AND PELVIS WITH CONTRAST TECHNIQUE: Multidetector CT imaging  of the abdomen and pelvis was performed using the standard protocol following bolus administration of intravenous contrast. CONTRAST:  8mL OMNIPAQUE IOHEXOL 300 MG/ML  SOLN COMPARISON:  None. FINDINGS: Motion degraded images. Lower chest: Emphysematous changes with bronchiectasis in the right middle lobe, lingula, and bilateral lower lobes. 8 mm medial right lower lobe nodule (series 4/image 4), grossly unchanged from 2007, benign. Hepatobiliary: Liver is within normal limits. Multiple gallstones, without associated inflammatory changes. No intrahepatic or extrahepatic ductal dilatation. Pancreas: Within normal limits. Spleen: Within normal limits. Adrenals/Urinary Tract: Adrenal glands are within normal limits. Kidneys are grossly unremarkable.  No hydronephrosis. Large left inferolateral bladder mass measuring at least 6.4 x 3.9 cm (series 2/image 70), suspicious for primary bladder neoplasm. Additional 1.2 x 2.8 cm lesion along the right anterior bladder (series 2/image 69), also worrisome for tumor. Stomach/Bowel: Stomach is within normal limits. No evidence of bowel obstruction. Appendix is not discretely visualized. Vascular/Lymphatic: No evidence of abdominal aortic aneurysm. Atherosclerotic calcifications of the abdominal aorta and branch vessels. No suspicious abdominopelvic lymphadenopathy. Reproductive: Uterus is within normal limits. No adnexal masses, noting streak artifact obscures the left pelvis. Other: No abdominopelvic ascites. Musculoskeletal: Moderate to severe compression fracture deformities at T11 and L1 (sagittal image 41), unchanged from 2015. Mild superior endplate compression fracture deformity at L3, also unchanged. No retropulsion. Left hip arthroplasty, without evidence of complication. IMPRESSION: Motion degraded images. Multifocal bladder masses, suspicious for primary bladder neoplasm. Urology consultation is suggested for cystoscopy if tissue diagnosis is desired. No findings  specific for synchronous or metastatic disease. 8 mm medial right lower lobe nodule, grossly unchanged from 2007, benign. Cholelithiasis, without associated inflammatory changes. Electronically Signed   By: Julian Hy M.D.   On: 03/01/2019 17:18   Dg Knee Complete 4 Views Right  Result Date: 03/01/2019 CLINICAL DATA:  Fall 6 weeks prior with pain in right knee EXAM: RIGHT KNEE - COMPLETE 4+ VIEW COMPARISON:  None. FINDINGS: The osseous structures appear diffusely demineralized which may limit detection of small or nondisplaced fractures. There is a questionable avulsion type fracture of the lateral tibial spine. Mild tricompartmental degenerative changes are present. Medial and lateral meniscal chondrocalcinosis is noted. No sizeable joint effusion. Atherosclerotic calcifications noted posterior to the knee. IMPRESSION: 1. Questionable avulsion fracture of the lateral tibial spine. 2. Diffuse osseous demineralization. 3. Tricompartmental degenerative changes with medial  and lateral meniscal chondrocalcinosis may reflect underlying calcium pyrophosphate deposition. Electronically Signed   By: Lovena Le M.D.   On: 03/01/2019 17:17      Assessment/Plan Principal Problem:   Hematuria Active Problems:   Acquired hypothyroidism   Non-rheumatic mitral regurgitation   Vitamin B 12 deficiency     Principal Problem: Hematuria:  Patient has had intermittent hematuria for several months.  Significantly increased over the last week.  Has noted frank hematuria with blood clots.  CT of the abdomen shows multiple bladder masses possibly neoplasm.   Plan: Monitor CBC Urology consult in a.m. IV fluid resuscitation  Other Active Problems: Possible right tibial fracture Patient had a fall 6 weeks ago and has had right knee pain.  X-ray of the right knee shows possible avulsion fracture of the right tibial spine.  No pain reported at the time of my interview.  Consider doing further imaging if  patient has symptoms.  Acute on chronic normocytic anemia: Hemoglobin 10.8 on presentation.  Appears to have been declining over the last 2 weeks.  Hemoglobin was 11.5 on 02/09/2019.  Possibly secondary to persistent hematuria.  Will monitor CBC closely.  Anemia work-up for a.m.  Hypothyroidism: Continue levothyroxine  History of paroxysmal atrial fibrillation/nonrheumatic mitral regurgitation: Continue digoxin Was only on aspirin for anticoagulation, will be kept on hold due to persistent hematuria.  Osteoporosis: Vitamin D level for a.m. We will place on calcium, vitamin D    DVT prophylaxis: SCDs Code Status: Patient wishes to be DNR Family Communication: N/A  Disposition Plan: Admitted as inpatient for evaluation of hematuria, bladder masses, possible UTI.  Will need urology consult in a.m.  Disposition based on clinical course, expect about 3 days. Consults called: N/A Admission status: Inpatient status   Lynetta Mare MD [facility]  If 7PM-7AM, please contact night-coverage   03/01/2019, 8:34 PM

## 2019-03-01 NOTE — ED Provider Notes (Signed)
[facility] EMERGENCY DEPARTMENT Provider Note   CSN: [id number] Arrival date & time: 03/01/19  1209     History   Chief Complaint Chief Complaint  Patient presents with   Hematuria    HPI Brandy Fitzpatrick is a 85 y.o. female.     Patient has recurrence of urinary tract infection with hematuria.  She was post to get a CT scan done on her abdomen tomorrow for this.  Patient started again having blood in her urine  The history is provided by the patient and a relative. No language interpreter was used.  Hematuria This is a new problem. The current episode started more than 2 days ago. The problem occurs constantly. The problem has not changed since onset.Pertinent negatives include no chest pain, no abdominal pain and no headaches. Nothing aggravates the symptoms. Nothing relieves the symptoms. She has tried nothing for the symptoms. The treatment provided no relief.    Past Medical History:  Diagnosis Date   Blind right eye 2017   possible stroke in eye; nerves burned so it would no longer cause pain   Fracture of unspecified part of neck of left femur, initial encounter for closed fracture ([facility]) 05/16/2015   Thyroid disease     Patient Active Problem List   Diagnosis Date Noted   Non-rheumatic mitral regurgitation 05/21/2015   Vitamin B 12 deficiency 05/18/2015   Acquired hypothyroidism 05/16/2015    Past Surgical History:  Procedure Laterality Date   JOINT REPLACEMENT  2017   hip     OB History   No obstetric history on file.      Home Medications    Prior to Admission medications   Medication Sig Start Date End Date Taking? Authorizing Provider  aspirin 81 MG chewable tablet Chew by mouth daily.    [provider]  digoxin (LANOXIN) 0.125 MG tablet Take 1 tablet (0.125 mg total) by mouth daily. 10/08/18   Loman Brooklyn, FNP  levothyroxine (SYNTHROID) 112 MCG tablet Take 1 tablet (112 mcg total) by mouth daily before breakfast. 10/08/18    Loman Brooklyn, FNP  Propylene Glycol (SYSTANE BALANCE) 0.6 % SOLN Apply 1-2 drops to eye 4 (four) times daily as needed. 02/09/19   Loman Brooklyn, FNP  triamcinolone cream (KENALOG) 0.1 % Apply 1 application topically 2 (two) times daily. 02/09/19   Loman Brooklyn, FNP    Family History Family History  Problem Relation Age of Onset   Stroke Mother    Heart disease Mother    Stroke Father    Heart disease Father    Parkinson's disease Sister    Dementia Sister    Stroke Sister    Heart attack Brother     Social History Social History   Tobacco Use   Smoking status: Never Smoker   Smokeless tobacco: Never Used  Substance Use Topics   Alcohol use: Never    Frequency: Never   Drug use: Never     Allergies   Codeine, Propoxyphene, and Sulfa antibiotics   Review of Systems Review of Systems  Constitutional: Negative for appetite change and fatigue.  HENT: Negative for congestion, ear discharge and sinus pressure.   Eyes: Negative for discharge.  Respiratory: Negative for cough.   Cardiovascular: Negative for chest pain.  Gastrointestinal: Negative for abdominal pain and diarrhea.  Genitourinary: Positive for hematuria. Negative for frequency.  Musculoskeletal: Negative for back pain.  Skin: Negative for rash.  Neurological: Negative for seizures and headaches.  Psychiatric/Behavioral:  Negative for hallucinations.     Physical Exam Updated Vital Signs BP (!) 140/119 (BP Location: Right Arm)    Pulse 80    Temp 99.4 F (37.4 C) (Oral)    Resp 18    Ht 5' (1.524 m)    Wt 51.3 kg    SpO2 95%    BMI 22.07 kg/m   Physical Exam Vitals and nursing note reviewed.  Constitutional:      Appearance: She is well-developed.  HENT:     Head: Normocephalic.     Nose: Nose normal.  Eyes:     General: No scleral icterus.    Conjunctiva/sclera: Conjunctivae normal.  Neck:     Thyroid: No thyromegaly.  Cardiovascular:     Rate and Rhythm: Normal rate  and regular rhythm.     Heart sounds: No murmur. No friction rub. No gallop.   Pulmonary:     Breath sounds: No stridor. No wheezing or rales.  Chest:     Chest wall: No tenderness.  Abdominal:     General: There is no distension.     Tenderness: There is no abdominal tenderness. There is no rebound.  Musculoskeletal:        General: Normal range of motion.     Cervical back: Neck supple.  Lymphadenopathy:     Cervical: No cervical adenopathy.  Skin:    Findings: No erythema or rash.  Neurological:     Mental Status: She is alert and oriented to person, place, and time.     Motor: No abnormal muscle tone.     Coordination: Coordination normal.  Psychiatric:        Behavior: Behavior normal.      ED Treatments / Results  Labs (all labs ordered are listed, but only abnormal results are displayed) Labs Reviewed  URINALYSIS, ROUTINE W REFLEX MICROSCOPIC - Abnormal; Notable for the following components:      Result Value   Color, Urine RED (*)    APPearance TURBID (*)    Glucose, UA   (*)    Value: TEST NOT REPORTED DUE TO COLOR INTERFERENCE OF URINE PIGMENT   Hgb urine dipstick   (*)    Value: TEST NOT REPORTED DUE TO COLOR INTERFERENCE OF URINE PIGMENT   Bilirubin Urine   (*)    Value: TEST NOT REPORTED DUE TO COLOR INTERFERENCE OF URINE PIGMENT   Ketones, ur   (*)    Value: TEST NOT REPORTED DUE TO COLOR INTERFERENCE OF URINE PIGMENT   Protein, ur   (*)    Value: TEST NOT REPORTED DUE TO COLOR INTERFERENCE OF URINE PIGMENT   Nitrite   (*)    Value: TEST NOT REPORTED DUE TO COLOR INTERFERENCE OF URINE PIGMENT   Leukocytes,Ua   (*)    Value: TEST NOT REPORTED DUE TO COLOR INTERFERENCE OF URINE PIGMENT   All other components within normal limits  CBC WITH DIFFERENTIAL/PLATELET - Abnormal; Notable for the following components:   Hemoglobin 10.8 (*)    All other components within normal limits  URINALYSIS, MICROSCOPIC (REFLEX) - Abnormal; Notable for the following  components:   Bacteria, UA MANY (*)    All other components within normal limits  COMPREHENSIVE METABOLIC PANEL - Abnormal; Notable for the following components:   Glucose, Bld 110 (*)    All other components within normal limits  URINE CULTURE    EKG None  Radiology Ct Abdomen Pelvis W Contrast  Result Date: 03/01/2019 CLINICAL DATA:  Hematuria,  abdominal distension EXAM: CT ABDOMEN AND PELVIS WITH CONTRAST TECHNIQUE: Multidetector CT imaging of the abdomen and pelvis was performed using the standard protocol following bolus administration of intravenous contrast. CONTRAST:  67mL OMNIPAQUE IOHEXOL 300 MG/ML  SOLN COMPARISON:  None. FINDINGS: Motion degraded images. Lower chest: Emphysematous changes with bronchiectasis in the right middle lobe, lingula, and bilateral lower lobes. 8 mm medial right lower lobe nodule (series 4/image 4), grossly unchanged from 2007, benign. Hepatobiliary: Liver is within normal limits. Multiple gallstones, without associated inflammatory changes. No intrahepatic or extrahepatic ductal dilatation. Pancreas: Within normal limits. Spleen: Within normal limits. Adrenals/Urinary Tract: Adrenal glands are within normal limits. Kidneys are grossly unremarkable.  No hydronephrosis. Large left inferolateral bladder mass measuring at least 6.4 x 3.9 cm (series 2/image 70), suspicious for primary bladder neoplasm. Additional 1.2 x 2.8 cm lesion along the right anterior bladder (series 2/image 69), also worrisome for tumor. Stomach/Bowel: Stomach is within normal limits. No evidence of bowel obstruction. Appendix is not discretely visualized. Vascular/Lymphatic: No evidence of abdominal aortic aneurysm. Atherosclerotic calcifications of the abdominal aorta and branch vessels. No suspicious abdominopelvic lymphadenopathy. Reproductive: Uterus is within normal limits. No adnexal masses, noting streak artifact obscures the left pelvis. Other: No abdominopelvic ascites.  Musculoskeletal: Moderate to severe compression fracture deformities at T11 and L1 (sagittal image 41), unchanged from 2015. Mild superior endplate compression fracture deformity at L3, also unchanged. No retropulsion. Left hip arthroplasty, without evidence of complication. IMPRESSION: Motion degraded images. Multifocal bladder masses, suspicious for primary bladder neoplasm. Urology consultation is suggested for cystoscopy if tissue diagnosis is desired. No findings specific for synchronous or metastatic disease. 8 mm medial right lower lobe nodule, grossly unchanged from 2007, benign. Cholelithiasis, without associated inflammatory changes. Electronically Signed   By: Julian Hy M.D.   On: 03/01/2019 17:18   Dg Knee Complete 4 Views Right  Result Date: 03/01/2019 CLINICAL DATA:  Fall 6 weeks prior with pain in right knee EXAM: RIGHT KNEE - COMPLETE 4+ VIEW COMPARISON:  None. FINDINGS: The osseous structures appear diffusely demineralized which may limit detection of small or nondisplaced fractures. There is a questionable avulsion type fracture of the lateral tibial spine. Mild tricompartmental degenerative changes are present. Medial and lateral meniscal chondrocalcinosis is noted. No sizeable joint effusion. Atherosclerotic calcifications noted posterior to the knee. IMPRESSION: 1. Questionable avulsion fracture of the lateral tibial spine. 2. Diffuse osseous demineralization. 3. Tricompartmental degenerative changes with medial and lateral meniscal chondrocalcinosis may reflect underlying calcium pyrophosphate deposition. Electronically Signed   By: Lovena Le M.D.   On: 03/01/2019 17:17    Procedures Procedures (including critical care time)  Medications Ordered in ED Medications  cephALEXin (KEFLEX) capsule 500 mg (has no administration in time range)  sodium chloride 0.9 % bolus 500 mL (0 mLs Intravenous Stopped 03/01/19 1636)  iohexol (OMNIPAQUE) 300 MG/ML solution 100 mL (75 mLs  Intravenous Contrast Given 03/01/19 1651)     Initial Impression / Assessment and Plan / ED Course  I have reviewed the triage vital signs and the nursing notes.  Pertinent labs & imaging results that were available during my care of the patient were reviewed by me and considered in my medical decision making (see chart for details).   Patient with urinary tract infection hematuria and possible bladder cancer.  She will be admitted for antibiotics     Final Clinical Impressions(s) / ED Diagnoses   Final diagnoses:  Acute cystitis without hematuria    ED Discharge Orders  None       Milton Ferguson, MD 03/05/19 [phone]

## 2019-03-02 ENCOUNTER — Encounter (HOSPITAL_COMMUNITY): Payer: Self-pay

## 2019-03-02 ENCOUNTER — Ambulatory Visit (HOSPITAL_COMMUNITY): Payer: Medicare Other

## 2019-03-02 DIAGNOSIS — E039 Hypothyroidism, unspecified: Secondary | ICD-10-CM

## 2019-03-02 DIAGNOSIS — R310 Gross hematuria: Secondary | ICD-10-CM

## 2019-03-02 DIAGNOSIS — D62 Acute posthemorrhagic anemia: Secondary | ICD-10-CM

## 2019-03-02 DIAGNOSIS — C678 Malignant neoplasm of overlapping sites of bladder: Secondary | ICD-10-CM

## 2019-03-02 LAB — CBC
HCT: 32.8 % — ABNORMAL LOW (ref 36.0–46.0)
Hemoglobin: 9.9 g/dL — ABNORMAL LOW (ref 12.0–15.0)
MCH: 26.9 pg (ref 26.0–34.0)
MCHC: 30.2 g/dL (ref 30.0–36.0)
MCV: 89.1 fL (ref 80.0–100.0)
Platelets: 163 10*3/uL (ref 150–400)
RBC: 3.68 MIL/uL — ABNORMAL LOW (ref 3.87–5.11)
RDW: 14.4 % (ref 11.5–15.5)
WBC: 4.1 10*3/uL (ref 4.0–10.5)
nRBC: 0 % (ref 0.0–0.2)

## 2019-03-02 LAB — SARS CORONAVIRUS 2 (TAT 6-24 HRS): SARS Coronavirus 2: NEGATIVE

## 2019-03-02 LAB — BASIC METABOLIC PANEL
Anion gap: 8 (ref 5–15)
BUN: 18 mg/dL (ref 8–23)
CO2: 25 mmol/L (ref 22–32)
Calcium: 9.4 mg/dL (ref 8.9–10.3)
Chloride: 109 mmol/L (ref 98–111)
Creatinine, Ser: 0.56 mg/dL (ref 0.44–1.00)
GFR calc Af Amer: >60 mL/min (ref >60)
GFR calc non Af Amer: >60 mL/min (ref >60)
Glucose, Bld: 87 mg/dL (ref 70–99)
Potassium: 4 mmol/L (ref 3.5–5.1)
Sodium: 142 mmol/L (ref 135–145)

## 2019-03-02 LAB — DIGOXIN LEVEL: Digoxin Level: 0.9 ng/mL (ref 0.8–2.0)

## 2019-03-02 MED ORDER — CHLORHEXIDINE GLUCONATE CLOTH 2 % EX PADS
6.0000 | MEDICATED_PAD | Freq: Every day | CUTANEOUS | Status: DC
  Administered 2019-03-02 – 2019-03-03 (×2): 6 via TOPICAL

## 2019-03-02 NOTE — Progress Notes (Addendum)
Initial Nutrition Assessment  DOCUMENTATION CODES:      INTERVENTION:  Regular diet   Ensure Enlive po BID, each supplement provides 350 kcal and 20 grams of protein   NUTRITION DIAGNOSIS:   Increased nutrient needs related to acute illness(bladder mass) as evidenced by estimated needs(Findings-abdomenal CT).   GOAL:  Patient will meet greater than or equal to 90% of their needs   MONITOR:  PO intake, Labs, Weight trends, Skin, Supplement acceptance   REASON FOR ASSESSMENT:   Malnutrition Screening Tool    ASSESSMENT:  Patient is a 85 yo female who presents with gross hematuria which is not a new problem for her. Acute blood loss anemia.  CT of abdomen: large bladder mass    Patient lives alone. Independent with cooking, shopping. Usually eats 3 meals daily and has a snack mid-morning. Feeds herself but does have blindness in her right eye. She denies significant change in appetite or intake. No meal consumption data available at this time for comparison.   Medications reviewed and include: synthroid, digoxin   Labs: Hgb 9.9 (L) BMP Latest Ref Rng & Units 03/02/2019 03/01/2019 02/09/2019  Glucose 70 - 99 mg/dL 87 110(H) 87  BUN 8 - 23 mg/dL 18 21 18   Creatinine 0.44 - 1.00 mg/dL 0.56 0.74 0.82  BUN/Creat Ratio 12 - 28 - - 22  Sodium 135 - 145 mmol/L 142 138 140  Potassium 3.5 - 5.1 mmol/L 4.0 4.3 4.3  Chloride 98 - 111 mmol/L 109 106 104  CO2 22 - 32 mmol/L 25 24 22   Calcium 8.9 - 10.3 mg/dL 9.4 9.8 10.1     NUTRITION - FOCUSED PHYSICAL EXAM: Nutrition-Focused physical exam completed. Findings are no fat depletion, mild temporal muscle depletion, and no edema.    Diet Order:   Diet Order            Diet regular Room service appropriate? Yes; Fluid consistency: Thin  Diet effective now              EDUCATION NEEDS:  Education needs have been addressed Skin:  Skin Assessment: Reviewed RN Assessment- very dry  Last BM:  12/7  Height:   Ht Readings from  Last 1 Encounters:  03/01/19 5' (1.524 m)    Weight:   Wt Readings from Last 1 Encounters:  03/02/19 48.2 kg    Ideal Body Weight:  45 kg  BMI:  Body mass index is 20.75 kg/m.  Estimated Nutritional Needs:   Kcal:  [id number]  Protein:  58-67 gr  Fluid:  1.2-1.3 liters   Colman Cater MS,RD,CSG,LDN Office: [phone] Pager: [phone]

## 2019-03-02 NOTE — Progress Notes (Signed)
PROGRESS NOTE  Brandy Fitzpatrick [id number] DOB: Feb 16, 1920 DOA: 03/01/2019 PCP: Loman Brooklyn, FNP  Brief History:  85 year old female with a history of hypothyroidism, B12 deficiency presenting with gross hematuria.  The patient states that she has had intermittent gross hematuria since May 2020.  She denies any fevers, chills, chest pain, shortness breath, dizziness, hematochezia, melena.  She states that she does have intermittent dysuria and suprapubic pain.  The patient has been on 3 separate antibiotic courses for the last 3 months.  Apparently, her hematuria resolves while she is on antibiotics, but returns after she is finished.  Most recently, the patient finished a course of antibiotics on 02/20/2019.  She does not recall the name of the antibiotics.  She went to see urology on 02/09/2019, and they scheduled a CT of the abdomen and pelvis which was supposed to be done on 03/02/2019.  Nevertheless, the patient has had worsening constant gross hematuria for the past week.  As result she presented for further evaluation.  CT of the abdomen and pelvis in the ED showed a large inferolateral bladder mass 6.4 x 3.9 cm suspicious for bladder neoplasm.  There was also an anterior bladder mass measuring 1.2 x 2.8 cm.  The patient had low-grade temperature of 99.4 F but was hemodynamically stable.  Hemoglobin was 10.8 in the emergency department with platelets 187,000 and WBC 4.1.  UA showed 6-10 WBC and >50 RBC.  Assessment/Plan: Gross hematuria -Secondary to bladder masses -Urology consult--spoke with Dr. Gloriann Loan  Right tibial spine avulsion fracture -03/01/2019 right knee x-ray--lateral tibial spine avulsion fx -I discussed with ortho, Dr. Lenna Sciara. Rogers-->nonoperative management, WBAT  Acute blood loss anemia -Hemoglobin has dropped from 11.5 (01/30/2019) to 9.9 -She remains hemodynamically stable -A.m. CBC  Hypothyroidism -Continue levothyroxine  Digoxin usage -Patient does not  know why she is on digoxin which she has taken for 30 years         Disposition Plan:   Home in 1-2 days  Family Communication:   Daughter updated on phone  Consultants:  Urology  Code Status:   DNR  DVT Prophylaxis:  SCDs   Procedures: As Listed in Progress Note Above  Antibiotics: None       Subjective: Pt complains of persistent hematuria.  Patient denies fevers, chills, headache, chest pain, dyspnea, nausea, vomiting, diarrhea, abdominal pain, hematuria, hematochezia, and melena.   Objective: Vitals:   03/01/19 2300 03/01/19 2330 03/02/19 0103 03/02/19 0546  BP: 129/68 139/67 124/73 (!) 111/53  Pulse: 63 65 64 63  Resp:  17 16 16   Temp:   97.6 F (36.4 C) 98.5 F (36.9 C)  TempSrc:   Oral Oral  SpO2: 98% 95% 94% 96%  Weight:   48.2 kg 48.2 kg  Height:        Intake/Output Summary (Last 24 hours) at 03/02/2019 0804 Last data filed at 03/02/2019 0600 Gross per 24 hour  Intake 371.15 ml  Output 0 ml  Net 371.15 ml   Weight change:  Exam:   General:  Pt is alert, follows commands appropriately, not in acute distress  HEENT: No icterus, No thrush, No neck mass, [place]/AT  Cardiovascular: RRR, S1/S2, no rubs, no gallops  Respiratory: CTA bilaterally, no wheezing, no crackles, no rhonchi  Abdomen: Soft/+BS, non tender, non distended, no guarding  Extremities: No edema, No lymphangitis, No petechiae, No rashes, no synovitis   Data Reviewed: I have personally reviewed following labs and imaging  studies Basic Metabolic Panel: Recent Labs  Lab 03/01/19 1453 03/02/19 0449  NA 138 142  K 4.3 4.0  CL 106 109  CO2 24 25  GLUCOSE 110* 87  BUN 21 18  CREATININE 0.74 0.56  CALCIUM 9.8 9.4   Liver Function Tests: Recent Labs  Lab 03/01/19 1453  AST 24  ALT 15  ALKPHOS 96  BILITOT 0.7  PROT 6.8  ALBUMIN 3.6   No results for input(s): LIPASE, AMYLASE in the last 168 hours. No results for input(s): AMMONIA in the last 168  hours. Coagulation Profile: No results for input(s): INR, PROTIME in the last 168 hours. CBC: Recent Labs  Lab 03/01/19 1453 03/02/19 0449  WBC 5.0 4.1  NEUTROABS 3.2  --   HGB 10.8* 9.9*  HCT 36.0 32.8*  MCV 88.9 89.1  PLT 187 163   Cardiac Enzymes: No results for input(s): CKTOTAL, CKMB, CKMBINDEX, TROPONINI in the last 168 hours. BNP: Invalid input(s): POCBNP CBG: No results for input(s): GLUCAP in the last 168 hours. HbA1C: No results for input(s): HGBA1C in the last 72 hours. Urine analysis:    Component Value Date/Time   COLORURINE RED (A) 03/01/2019 1309   APPEARANCEUR TURBID (A) 03/01/2019 1309   LABSPEC  03/01/2019 1309    TEST NOT REPORTED DUE TO COLOR INTERFERENCE OF URINE PIGMENT   PHURINE  03/01/2019 1309    TEST NOT REPORTED DUE TO COLOR INTERFERENCE OF URINE PIGMENT   GLUCOSEU (A) 03/01/2019 1309    TEST NOT REPORTED DUE TO COLOR INTERFERENCE OF URINE PIGMENT   HGBUR (A) 03/01/2019 1309    TEST NOT REPORTED DUE TO COLOR INTERFERENCE OF URINE PIGMENT   BILIRUBINUR (A) 03/01/2019 1309    TEST NOT REPORTED DUE TO COLOR INTERFERENCE OF URINE PIGMENT   KETONESUR (A) 03/01/2019 1309    TEST NOT REPORTED DUE TO COLOR INTERFERENCE OF URINE PIGMENT   PROTEINUR (A) 03/01/2019 1309    TEST NOT REPORTED DUE TO COLOR INTERFERENCE OF URINE PIGMENT   NITRITE (A) 03/01/2019 1309    TEST NOT REPORTED DUE TO COLOR INTERFERENCE OF URINE PIGMENT   LEUKOCYTESUR (A) 03/01/2019 1309    TEST NOT REPORTED DUE TO COLOR INTERFERENCE OF URINE PIGMENT   Sepsis Labs: @LABRCNTIP (procalcitonin:4,lacticidven:4) )No results found for this or any previous visit (from the past 240 hour(s)).   Scheduled Meds:  digoxin  0.125 mg Oral Daily   levothyroxine  112 mcg Oral QAC breakfast   sodium chloride flush  3 mL Intravenous Q12H   Continuous Infusions:  sodium chloride     lactated ringers 75 mL/hr at 03/02/19 0143    Procedures/Studies: Ct Abdomen Pelvis W  Contrast  Result Date: 03/01/2019 CLINICAL DATA:  Hematuria, abdominal distension EXAM: CT ABDOMEN AND PELVIS WITH CONTRAST TECHNIQUE: Multidetector CT imaging of the abdomen and pelvis was performed using the standard protocol following bolus administration of intravenous contrast. CONTRAST:  40mL OMNIPAQUE IOHEXOL 300 MG/ML  SOLN COMPARISON:  None. FINDINGS: Motion degraded images. Lower chest: Emphysematous changes with bronchiectasis in the right middle lobe, lingula, and bilateral lower lobes. 8 mm medial right lower lobe nodule (series 4/image 4), grossly unchanged from 2007, benign. Hepatobiliary: Liver is within normal limits. Multiple gallstones, without associated inflammatory changes. No intrahepatic or extrahepatic ductal dilatation. Pancreas: Within normal limits. Spleen: Within normal limits. Adrenals/Urinary Tract: Adrenal glands are within normal limits. Kidneys are grossly unremarkable.  No hydronephrosis. Large left inferolateral bladder mass measuring at least 6.4 x 3.9 cm (series 2/image 70), suspicious  for primary bladder neoplasm. Additional 1.2 x 2.8 cm lesion along the right anterior bladder (series 2/image 69), also worrisome for tumor. Stomach/Bowel: Stomach is within normal limits. No evidence of bowel obstruction. Appendix is not discretely visualized. Vascular/Lymphatic: No evidence of abdominal aortic aneurysm. Atherosclerotic calcifications of the abdominal aorta and branch vessels. No suspicious abdominopelvic lymphadenopathy. Reproductive: Uterus is within normal limits. No adnexal masses, noting streak artifact obscures the left pelvis. Other: No abdominopelvic ascites. Musculoskeletal: Moderate to severe compression fracture deformities at T11 and L1 (sagittal image 41), unchanged from 2015. Mild superior endplate compression fracture deformity at L3, also unchanged. No retropulsion. Left hip arthroplasty, without evidence of complication. IMPRESSION: Motion degraded images.  Multifocal bladder masses, suspicious for primary bladder neoplasm. Urology consultation is suggested for cystoscopy if tissue diagnosis is desired. No findings specific for synchronous or metastatic disease. 8 mm medial right lower lobe nodule, grossly unchanged from 2007, benign. Cholelithiasis, without associated inflammatory changes. Electronically Signed   By: Julian Hy M.D.   On: 03/01/2019 17:18   Dg Knee Complete 4 Views Right  Result Date: 03/01/2019 CLINICAL DATA:  Fall 6 weeks prior with pain in right knee EXAM: RIGHT KNEE - COMPLETE 4+ VIEW COMPARISON:  None. FINDINGS: The osseous structures appear diffusely demineralized which may limit detection of small or nondisplaced fractures. There is a questionable avulsion type fracture of the lateral tibial spine. Mild tricompartmental degenerative changes are present. Medial and lateral meniscal chondrocalcinosis is noted. No sizeable joint effusion. Atherosclerotic calcifications noted posterior to the knee. IMPRESSION: 1. Questionable avulsion fracture of the lateral tibial spine. 2. Diffuse osseous demineralization. 3. Tricompartmental degenerative changes with medial and lateral meniscal chondrocalcinosis may reflect underlying calcium pyrophosphate deposition. Electronically Signed   By: Lovena Le M.D.   On: 03/01/2019 17:17    Orson Eva, DO  [facility] Pager [phone]  If 7PM-7AM, please contact night-coverage [email or web address] Password [email or web address] 03/02/2019, 8:04 AM   LOS: 1 day

## 2019-03-02 NOTE — Consult Note (Signed)
Urology Consult   Physician requesting consult: Tat  Reason for consult: Bladder mass, blood in urine  History of Present Illness: Brandy Fitzpatrick is a 85 y.o. female previously seen by Dr. Gloriann Loan in our [place] office for intermittent gross hematuria.  She did have hematuria CT eventually scheduled, but presented recently with persistent gross hematuria to the emergency room.  CT abdomen and pelvis with contrast revealed large bladder wall masses consistent with carcinoma.  Urologic consultation is requested.  The patient is very hard of hearing, but conversive and alert.  She is currently having no flank or abdominal pain.  She denies a history of voiding or storage urinary symptoms, hematuria, UTIs, STDs, urolithiasis, GU malignancy/trauma/surgery.  Past Medical History:  Diagnosis Date  . Blind right eye 2017   possible stroke in eye; nerves burned so it would no longer cause pain  . Fracture of unspecified part of neck of left femur, initial encounter for closed fracture ([place]) 05/16/2015  . Thyroid disease     Past Surgical History:  Procedure Laterality Date  . JOINT REPLACEMENT  2017   hip     Current Hospital Medications: Scheduled Meds: . digoxin  0.125 mg Oral Daily  . levothyroxine  112 mcg Oral QAC breakfast  . sodium chloride flush  3 mL Intravenous Q12H   Continuous Infusions: . sodium chloride    . lactated ringers 75 mL/hr at 03/02/19 0143   PRN Meds:.sodium chloride, acetaminophen **OR** acetaminophen, bisacodyl, ondansetron **OR** ondansetron (ZOFRAN) IV, polyethylene glycol, sodium chloride flush, traMADol  Allergies:  Allergies  Allergen Reactions  . Codeine Rash  . Propoxyphene Rash  . Sulfa Antibiotics Rash    Family History  Problem Relation Age of Onset  . Stroke Mother   . Heart disease Mother   . Stroke Father   . Heart disease Father   . Parkinson's disease Sister   . Dementia Sister   . Stroke Sister   . Heart attack Brother      Social History:  reports that she has never smoked. She has never used smokeless tobacco. She reports that she does not drink alcohol or use drugs.  ROS: A complete review of systems was performed.  All systems are negative except for pertinent findings as noted.  Physical Exam:  Vital signs in last 24 hours: Temp:  [97.6 F (36.4 C)-99.4 F (37.4 C)] 98.5 F (36.9 C) (12/08 0546) Pulse Rate:  [63-80] 63 (12/08 0546) Resp:  [16-18] 16 (12/08 0546) BP: (111-140)/(53-119) 111/53 (12/08 0546) SpO2:  [94 %-99 %] 96 % (12/08 0546) Weight:  [48.2 kg-51.3 kg] 48.2 kg (12/08 0546) General:  Alert and oriented, No acute distress.  Exceptionally hard of hearing. HEENT: Normocephalic, atraumatic Neck: No JVD or lymphadenopathy Cardiovascular: Regular rate  Lungs: Normal inspiratory and expiratory excursion` Abdomen: Soft, nontender, nondistended, no abdominal masses Back: No CVA tenderness Extremities: No edema Neurologic: Grossly intact  Laboratory Data:  Recent Labs    03/01/19 1453 03/02/19 0449  WBC 5.0 4.1  HGB 10.8* 9.9*  HCT 36.0 32.8*  PLT 187 163    Recent Labs    03/01/19 1453 03/02/19 0449  NA 138 142  K 4.3 4.0  CL 106 109  GLUCOSE 110* 87  BUN 21 18  CALCIUM 9.8 9.4  CREATININE 0.74 0.56     Results for orders placed or performed during the hospital encounter of 03/01/19 (from the past 24 hour(s))  Urinalysis, Routine w reflex microscopic- may I&O cath if menses  Status: Abnormal   Collection Time: 03/01/19  1:09 PM  Result Value Ref Range   Color, Urine RED (A) YELLOW   APPearance TURBID (A) CLEAR   Specific Gravity, Urine  1.005 - 1.030    TEST NOT REPORTED DUE TO COLOR INTERFERENCE OF URINE PIGMENT   pH  5.0 - 8.0    TEST NOT REPORTED DUE TO COLOR INTERFERENCE OF URINE PIGMENT   Glucose, UA (A) NEGATIVE mg/dL    TEST NOT REPORTED DUE TO COLOR INTERFERENCE OF URINE PIGMENT   Hgb urine dipstick (A) NEGATIVE    TEST NOT REPORTED DUE TO COLOR  INTERFERENCE OF URINE PIGMENT   Bilirubin Urine (A) NEGATIVE    TEST NOT REPORTED DUE TO COLOR INTERFERENCE OF URINE PIGMENT   Ketones, ur (A) NEGATIVE mg/dL    TEST NOT REPORTED DUE TO COLOR INTERFERENCE OF URINE PIGMENT   Protein, ur (A) NEGATIVE mg/dL    TEST NOT REPORTED DUE TO COLOR INTERFERENCE OF URINE PIGMENT   Nitrite (A) NEGATIVE    TEST NOT REPORTED DUE TO COLOR INTERFERENCE OF URINE PIGMENT   Leukocytes,Ua (A) NEGATIVE    TEST NOT REPORTED DUE TO COLOR INTERFERENCE OF URINE PIGMENT  Urinalysis, Microscopic (reflex)     Status: Abnormal   Collection Time: 03/01/19  1:09 PM  Result Value Ref Range   RBC / HPF >50 0 - 5 RBC/hpf   WBC, UA 6-10 0 - 5 WBC/hpf   Bacteria, UA MANY (A) NONE SEEN   Squamous Epithelial / LPF 0-5 0 - 5  CBC with Differential     Status: Abnormal   Collection Time: 03/01/19  2:53 PM  Result Value Ref Range   WBC 5.0 4.0 - 10.5 K/uL   RBC 4.05 3.87 - 5.11 MIL/uL   Hemoglobin 10.8 (L) 12.0 - 15.0 g/dL   HCT 36.0 36.0 - 46.0 %   MCV 88.9 80.0 - 100.0 fL   MCH 26.7 26.0 - 34.0 pg   MCHC 30.0 30.0 - 36.0 g/dL   RDW 14.5 11.5 - 15.5 %   Platelets 187 150 - 400 K/uL   nRBC 0.0 0.0 - 0.2 %   Neutrophils Relative % 65 %   Neutro Abs 3.2 1.7 - 7.7 K/uL   Lymphocytes Relative 22 %   Lymphs Abs 1.1 0.7 - 4.0 K/uL   Monocytes Relative 9 %   Monocytes Absolute 0.5 0.1 - 1.0 K/uL   Eosinophils Relative 3 %   Eosinophils Absolute 0.1 0.0 - 0.5 K/uL   Basophils Relative 1 %   Basophils Absolute 0.0 0.0 - 0.1 K/uL   Immature Granulocytes 0 %   Abs Immature Granulocytes 0.01 0.00 - 0.07 K/uL  Comprehensive metabolic panel     Status: Abnormal   Collection Time: 03/01/19  2:53 PM  Result Value Ref Range   Sodium 138 135 - 145 mmol/L   Potassium 4.3 3.5 - 5.1 mmol/L   Chloride 106 98 - 111 mmol/L   CO2 24 22 - 32 mmol/L   Glucose, Bld 110 (H) 70 - 99 mg/dL   BUN 21 8 - 23 mg/dL   Creatinine, Ser 0.74 0.44 - 1.00 mg/dL   Calcium 9.8 8.9 - 10.3 mg/dL    Total Protein 6.8 6.5 - 8.1 g/dL   Albumin 3.6 3.5 - 5.0 g/dL   AST 24 15 - 41 U/L   ALT 15 0 - 44 U/L   Alkaline Phosphatase 96 38 - 126 U/L   Total Bilirubin 0.7  0.3 - 1.2 mg/dL   GFR calc non Af Amer >60 >60 mL/min   GFR calc Af Amer >60 >60 mL/min   Anion gap 8 5 - 15  SARS CORONAVIRUS 2 (TAT 6-24 HRS) Nasopharyngeal Nasopharyngeal Swab     Status: None   Collection Time: 03/01/19 10:41 PM   Specimen: Nasopharyngeal Swab  Result Value Ref Range   SARS Coronavirus 2 NEGATIVE NEGATIVE  Basic metabolic panel     Status: None   Collection Time: 03/02/19  4:49 AM  Result Value Ref Range   Sodium 142 135 - 145 mmol/L   Potassium 4.0 3.5 - 5.1 mmol/L   Chloride 109 98 - 111 mmol/L   CO2 25 22 - 32 mmol/L   Glucose, Bld 87 70 - 99 mg/dL   BUN 18 8 - 23 mg/dL   Creatinine, Ser 0.56 0.44 - 1.00 mg/dL   Calcium 9.4 8.9 - 10.3 mg/dL   GFR calc non Af Amer >60 >60 mL/min   GFR calc Af Amer >60 >60 mL/min   Anion gap 8 5 - 15  CBC     Status: Abnormal   Collection Time: 03/02/19  4:49 AM  Result Value Ref Range   WBC 4.1 4.0 - 10.5 K/uL   RBC 3.68 (L) 3.87 - 5.11 MIL/uL   Hemoglobin 9.9 (L) 12.0 - 15.0 g/dL   HCT 32.8 (L) 36.0 - 46.0 %   MCV 89.1 80.0 - 100.0 fL   MCH 26.9 26.0 - 34.0 pg   MCHC 30.2 30.0 - 36.0 g/dL   RDW 14.4 11.5 - 15.5 %   Platelets 163 150 - 400 K/uL   nRBC 0.0 0.0 - 0.2 %  Digoxin level     Status: None   Collection Time: 03/02/19  4:49 AM  Result Value Ref Range   Digoxin Level 0.9 0.8 - 2.0 ng/mL   Recent Results (from the past 240 hour(s))  SARS CORONAVIRUS 2 (TAT 6-24 HRS) Nasopharyngeal Nasopharyngeal Swab     Status: None   Collection Time: 03/01/19 10:41 PM   Specimen: Nasopharyngeal Swab  Result Value Ref Range Status   SARS Coronavirus 2 NEGATIVE NEGATIVE Final    Comment: (NOTE) SARS-CoV-2 target nucleic acids are NOT DETECTED. The SARS-CoV-2 RNA is generally detectable in upper and lower respiratory specimens during the acute phase of  infection. Negative results do not preclude SARS-CoV-2 infection, do not rule out co-infections with other pathogens, and should not be used as the sole basis for treatment or other patient management decisions. Negative results must be combined with clinical observations, patient history, and epidemiological information. The expected result is Negative. Fact Sheet for Patients: SugarRoll.be Fact Sheet for Healthcare Providers: https://www.woods-mathews.com/ This test is not yet approved or cleared by the [place] FDA and  has been authorized for detection and/or diagnosis of SARS-CoV-2 by FDA under an Emergency Use Authorization (EUA). This EUA will remain  in effect (meaning this test can be used) for the duration of the COVID-19 declaration under Section 56 4(b)(1) of the Act, 21 U.S.C. section 360bbb-3(b)(1), unless the authorization is terminated or revoked sooner. Performed at [facility], [place] [address] [place], [place] [address]     Renal Function: Recent Labs    03/01/19 1453 03/02/19 0449  CREATININE 0.74 0.56   Estimated Creatinine Clearance: 29.5 mL/min (by C-G formula based on SCr of 0.56 mg/dL).  Radiologic Imaging: Ct Abdomen Pelvis W Contrast  Result Date: 03/01/2019 CLINICAL DATA:  Hematuria, abdominal  distension EXAM: CT ABDOMEN AND PELVIS WITH CONTRAST TECHNIQUE: Multidetector CT imaging of the abdomen and pelvis was performed using the standard protocol following bolus administration of intravenous contrast. CONTRAST:  39mL OMNIPAQUE IOHEXOL 300 MG/ML  SOLN COMPARISON:  None. FINDINGS: Motion degraded images. Lower chest: Emphysematous changes with bronchiectasis in the right middle lobe, lingula, and bilateral lower lobes. 8 mm medial right lower lobe nodule (series 4/image 4), grossly unchanged from 2007, benign. Hepatobiliary: Liver is within normal limits. Multiple gallstones, without associated  inflammatory changes. No intrahepatic or extrahepatic ductal dilatation. Pancreas: Within normal limits. Spleen: Within normal limits. Adrenals/Urinary Tract: Adrenal glands are within normal limits. Kidneys are grossly unremarkable.  No hydronephrosis. Large left inferolateral bladder mass measuring at least 6.4 x 3.9 cm (series 2/image 70), suspicious for primary bladder neoplasm. Additional 1.2 x 2.8 cm lesion along the right anterior bladder (series 2/image 69), also worrisome for tumor. Stomach/Bowel: Stomach is within normal limits. No evidence of bowel obstruction. Appendix is not discretely visualized. Vascular/Lymphatic: No evidence of abdominal aortic aneurysm. Atherosclerotic calcifications of the abdominal aorta and branch vessels. No suspicious abdominopelvic lymphadenopathy. Reproductive: Uterus is within normal limits. No adnexal masses, noting streak artifact obscures the left pelvis. Other: No abdominopelvic ascites. Musculoskeletal: Moderate to severe compression fracture deformities at T11 and L1 (sagittal image 41), unchanged from 2015. Mild superior endplate compression fracture deformity at L3, also unchanged. No retropulsion. Left hip arthroplasty, without evidence of complication. IMPRESSION: Motion degraded images. Multifocal bladder masses, suspicious for primary bladder neoplasm. Urology consultation is suggested for cystoscopy if tissue diagnosis is desired. No findings specific for synchronous or metastatic disease. 8 mm medial right lower lobe nodule, grossly unchanged from 2007, benign. Cholelithiasis, without associated inflammatory changes. Electronically Signed   By: Julian Hy M.D.   On: 03/01/2019 17:18   Dg Knee Complete 4 Views Right  Result Date: 03/01/2019 CLINICAL DATA:  Fall 6 weeks prior with pain in right knee EXAM: RIGHT KNEE - COMPLETE 4+ VIEW COMPARISON:  None. FINDINGS: The osseous structures appear diffusely demineralized which may limit detection of small  or nondisplaced fractures. There is a questionable avulsion type fracture of the lateral tibial spine. Mild tricompartmental degenerative changes are present. Medial and lateral meniscal chondrocalcinosis is noted. No sizeable joint effusion. Atherosclerotic calcifications noted posterior to the knee. IMPRESSION: 1. Questionable avulsion fracture of the lateral tibial spine. 2. Diffuse osseous demineralization. 3. Tricompartmental degenerative changes with medial and lateral meniscal chondrocalcinosis may reflect underlying calcium pyrophosphate deposition. Electronically Signed   By: Lovena Le M.D.   On: 03/01/2019 17:17    I independently reviewed the above imaging studies.  Urine culture is pending.  Last urine culture from 17 November revealed no specific pathogens.  Impression/Assessment:  Gross hematuria secondary to fairly large bladder tumors.  She is not significantly anemic at this time and is relatively asymptomatic.  Plan:  I discussed management with the patient and her daughter.  I think it worthwhile at this time to place a Foley catheter to adequately decompress her bladder which may help some of the bleeding.  We talked about palliative management with transfusions if she becomes anemic versus cystoscopy, transurethral resection of bladder tumor.  This is obviously a big step for a 84 year old, and if this is to be done it would be best done at [facility] because of anesthetic concerns and postoperative care.  I will discuss this with Dr. Gloriann Loan.  We will check on the patient tomorrow.  The patient's family will  discuss how they feel about proceeding.

## 2019-03-03 DIAGNOSIS — C678 Malignant neoplasm of overlapping sites of bladder: Secondary | ICD-10-CM

## 2019-03-03 DIAGNOSIS — R310 Gross hematuria: Secondary | ICD-10-CM

## 2019-03-03 LAB — CBC
HCT: 37 % (ref 36.0–46.0)
Hemoglobin: 11.4 g/dL — ABNORMAL LOW (ref 12.0–15.0)
MCH: 27 pg (ref 26.0–34.0)
MCHC: 30.8 g/dL (ref 30.0–36.0)
MCV: 87.5 fL (ref 80.0–100.0)
Platelets: 228 10*3/uL (ref 150–400)
RBC: 4.23 MIL/uL (ref 3.87–5.11)
RDW: 14.4 % (ref 11.5–15.5)
WBC: 11.6 10*3/uL — ABNORMAL HIGH (ref 4.0–10.5)
nRBC: 0 % (ref 0.0–0.2)

## 2019-03-03 LAB — GLUCOSE, CAPILLARY
Glucose-Capillary: 145 mg/dL — ABNORMAL HIGH (ref 70–99)
Glucose-Capillary: 113 mg/dL — ABNORMAL HIGH (ref 70–99)

## 2019-03-03 LAB — DIGOXIN LEVEL: Digoxin Level: 0.7 ng/mL — ABNORMAL LOW (ref 0.8–2.0)

## 2019-03-03 MED ORDER — MORPHINE SULFATE (PF) 2 MG/ML IV SOLN
1.0000 mg | Freq: Once | INTRAVENOUS | Status: AC
  Administered 2019-03-03: 07:00:00 1 mg via INTRAVENOUS
  Filled 2019-03-03: qty 1

## 2019-03-03 MED ORDER — PANTOPRAZOLE SODIUM 40 MG PO TBEC
40.0000 mg | DELAYED_RELEASE_TABLET | Freq: Every day | ORAL | Status: DC
  Administered 2019-03-03 – 2019-03-04 (×2): 40 mg via ORAL
  Filled 2019-03-03 (×2): qty 1

## 2019-03-03 MED ORDER — ALUM & MAG HYDROXIDE-SIMETH 200-200-20 MG/5ML PO SUSP: 30.0000 mL | Freq: Four times a day (QID) | ORAL | Status: DC | PRN

## 2019-03-03 NOTE — Progress Notes (Addendum)
Urine output of only 50 mls today.  Catheter flushed and net of 50 mls tea colored urine.  Scanned bladder and showed 40 mls . Cath had been leaking on bedpad, as well.

## 2019-03-03 NOTE — Progress Notes (Signed)
Subjective: No worsening pain this morning. Urine is dark red/brown. Family has decided to pursue conservative management since the patient is not a good surgical candidate  Objective: Vital signs in last 24 hours: Temp:  [97.4 F (36.3 C)-98.4 F (36.9 C)] 97.4 F (36.3 C) (12/09 0451) Pulse Rate:  [62-72] 62 (12/09 1433) Resp:  [16-20] 16 (12/09 1433) BP: (107-136)/(64-71) 107/64 (12/09 1433) SpO2:  [91 %-99 %] 91 % (12/09 1433)  Intake/Output from previous day: 12/08 0701 - 12/09 0700 In: 1233.6 [I.V.:1233.6] Out: 250 [Urine:250] Intake/Output this shift: No intake/output data recorded.  Physical Exam:  General:alert, cooperative and appears stated age GI: soft, non tender, normal bowel sounds, no palpable masses, no organomegaly, no inguinal hernia Female genitalia: not done Extremities: extremities normal, atraumatic, no cyanosis or edema  Lab Results: Recent Labs    03/01/19 1453 03/02/19 0449 03/03/19 0523  HGB 10.8* 9.9* 11.4*  HCT 36.0 32.8* 37.0   BMET Recent Labs    03/01/19 1453 03/02/19 0449  NA 138 142  K 4.3 4.0  CL 106 109  CO2 24 25  GLUCOSE 110* 87  BUN 21 18  CREATININE 0.74 0.56  CALCIUM 9.8 9.4   No results for input(s): LABPT, INR in the last 72 hours. No results for input(s): LABURIN in the last 72 hours. Results for orders placed or performed during the hospital encounter of 03/01/19  Urine culture     Status: Abnormal (Preliminary result)   Collection Time: 03/01/19  1:09 PM   Specimen: Urine, Random  Result Value Ref Range Status   Specimen Description   Final    URINE, RANDOM Performed at [facility], [address], [place], [place] [address]    Special Requests   Final    NONE Performed at [facility], [address], [place], [place] [address]    Culture (A)  Final    10,000 COLONIES/mL STAPHYLOCOCCUS AUREUS SUSCEPTIBILITIES TO FOLLOW Performed at [facility], [place] [address] [place], [place] [address]    Report Status PENDING  Incomplete  SARS CORONAVIRUS 2 (TAT 6-24 HRS) Nasopharyngeal Nasopharyngeal Swab     Status: None   Collection Time: 03/01/19 10:41 PM   Specimen: Nasopharyngeal Swab  Result Value Ref Range Status   SARS Coronavirus 2 NEGATIVE NEGATIVE Final    Comment: (NOTE) SARS-CoV-2 target nucleic acids are NOT DETECTED. The SARS-CoV-2 RNA is generally detectable in upper and lower respiratory specimens during the acute phase of infection. Negative results do not preclude SARS-CoV-2 infection, do not rule out co-infections with other pathogens, and should not be used as the sole basis for treatment or other patient management decisions. Negative results must be combined with clinical observations, patient history, and epidemiological information. The expected result is Negative. Fact Sheet for Patients: SugarRoll.be Fact Sheet for Healthcare Providers: https://www.woods-mathews.com/ This test is not yet approved or cleared by the [place] FDA and  has been authorized for detection and/or diagnosis of SARS-CoV-2 by FDA under an Emergency Use Authorization (EUA). This EUA will remain  in effect (meaning this test can be used) for the duration of the COVID-19 declaration under Section 56 4(b)(1) of the Act, 21 U.S.C. section 360bbb-3(b)(1), unless the authorization is terminated or revoked sooner. Performed at [facility], [place] [address], [place], [place] [address]     Studies/Results: Ct Abdomen Pelvis W Contrast  Result Date: 03/01/2019 CLINICAL DATA:  Hematuria, abdominal distension EXAM: CT ABDOMEN AND PELVIS WITH CONTRAST TECHNIQUE: Multidetector CT imaging  of the abdomen and pelvis was performed using the standard protocol following bolus administration of intravenous contrast. CONTRAST:  47mL OMNIPAQUE IOHEXOL 300 MG/ML  SOLN COMPARISON:  None. FINDINGS: Motion degraded images. Lower chest: Emphysematous changes  with bronchiectasis in the right middle lobe, lingula, and bilateral lower lobes. 8 mm medial right lower lobe nodule (series 4/image 4), grossly unchanged from 2007, benign. Hepatobiliary: Liver is within normal limits. Multiple gallstones, without associated inflammatory changes. No intrahepatic or extrahepatic ductal dilatation. Pancreas: Within normal limits. Spleen: Within normal limits. Adrenals/Urinary Tract: Adrenal glands are within normal limits. Kidneys are grossly unremarkable.  No hydronephrosis. Large left inferolateral bladder mass measuring at least 6.4 x 3.9 cm (series 2/image 70), suspicious for primary bladder neoplasm. Additional 1.2 x 2.8 cm lesion along the right anterior bladder (series 2/image 69), also worrisome for tumor. Stomach/Bowel: Stomach is within normal limits. No evidence of bowel obstruction. Appendix is not discretely visualized. Vascular/Lymphatic: No evidence of abdominal aortic aneurysm. Atherosclerotic calcifications of the abdominal aorta and branch vessels. No suspicious abdominopelvic lymphadenopathy. Reproductive: Uterus is within normal limits. No adnexal masses, noting streak artifact obscures the left pelvis. Other: No abdominopelvic ascites. Musculoskeletal: Moderate to severe compression fracture deformities at T11 and L1 (sagittal image 41), unchanged from 2015. Mild superior endplate compression fracture deformity at L3, also unchanged. No retropulsion. Left hip arthroplasty, without evidence of complication. IMPRESSION: Motion degraded images. Multifocal bladder masses, suspicious for primary bladder neoplasm. Urology consultation is suggested for cystoscopy if tissue diagnosis is desired. No findings specific for synchronous or metastatic disease. 8 mm medial right lower lobe nodule, grossly unchanged from 2007, benign. Cholelithiasis, without associated inflammatory changes. Electronically Signed   By: Julian Hy M.D.   On: 03/01/2019 17:18   Dg Knee  Complete 4 Views Right  Result Date: 03/01/2019 CLINICAL DATA:  Fall 6 weeks prior with pain in right knee EXAM: RIGHT KNEE - COMPLETE 4+ VIEW COMPARISON:  None. FINDINGS: The osseous structures appear diffusely demineralized which may limit detection of small or nondisplaced fractures. There is a questionable avulsion type fracture of the lateral tibial spine. Mild tricompartmental degenerative changes are present. Medial and lateral meniscal chondrocalcinosis is noted. No sizeable joint effusion. Atherosclerotic calcifications noted posterior to the knee. IMPRESSION: 1. Questionable avulsion fracture of the lateral tibial spine. 2. Diffuse osseous demineralization. 3. Tricompartmental degenerative changes with medial and lateral meniscal chondrocalcinosis may reflect underlying calcium pyrophosphate deposition. Electronically Signed   By: Lovena Le M.D.   On: 03/01/2019 17:17    Assessment/Plan: 85yo with multifocal bladder cancer and gross hematuria  1. Please continue indwelling foley. Her hematuria is improving and her foley can be removed once her urine is light pink/clear.    LOS: 2 days   Brandy Fitzpatrick 03/03/2019, 3:06 PM

## 2019-03-03 NOTE — Progress Notes (Signed)
PROGRESS NOTE  Brandy Fitzpatrick [id number] DOB: May 27, 1919 DOA: 03/01/2019 PCP: Loman Brooklyn, FNP  Brief History:  85 year old female with a history of hypothyroidism, B12 deficiency presenting with gross hematuria.  The patient states that she has had intermittent gross hematuria since May 2020.  She denies any fevers, chills, chest pain, shortness breath, dizziness, hematochezia, melena.  She states that she does have intermittent dysuria and suprapubic pain.  The patient has been on 3 separate antibiotic courses for the last 3 months.  Apparently, her hematuria resolves while she is on antibiotics, but returns after she is finished.  Most recently, the patient finished a course of antibiotics on 02/20/2019.  She does not recall the name of the antibiotics.  She went to see urology on 02/09/2019, and they scheduled a CT of the abdomen and pelvis which was supposed to be done on 03/02/2019.  Nevertheless, the patient has had worsening constant gross hematuria for the past week.  As result she presented for further evaluation.  CT of the abdomen and pelvis in the ED showed a large inferolateral bladder mass 6.4 x 3.9 cm suspicious for bladder neoplasm.  There was also an anterior bladder mass measuring 1.2 x 2.8 cm.  The patient had low-grade temperature of 99.4 F but was hemodynamically stable.  Hemoglobin was 10.8 in the emergency department with platelets 187,000 and WBC 4.1.  UA showed 6-10 WBC and >50 RBC.  Assessment/Plan: Gross hematuria -Secondary to bladder masses -Urology consult--failed patient not exactly a great candidate for surgical intervention.  Family in agreement to pursue conservative management for now -Continue Foley catheter and follow hematuria/hemoglobin response. -Will follow further urology recommendations  Right tibial spine avulsion fracture -03/01/2019 right knee x-ray--lateral tibial spine avulsion fx -I discussed with ortho, Dr. Lenna Sciara.  [place]-->nonoperative management, WBAT -Continue supportive care and as needed analgesics.  Acute blood loss anemia -Hemoglobin has dropped from 11.5 (01/30/2019) to 9.9 -She remains hemodynamically stable -Repeat hemoglobin 11.4.  Hypothyroidism -Continue levothyroxine  Digoxin usage -Patient does not know why she is on digoxin which she has taken for 30 years -Per daughter report she feels well secondary to paroxysmal A. Fib. -No frank documentation in her records for that.   Disposition Plan:   Home in 1-2 days  Family Communication:   Daughter updated on phone  Consultants:  Urology  Code Status:   DNR  DVT Prophylaxis:  SCDs   Procedures: As Listed in Progress Note Above  Antibiotics: None   Subjective: No fevers, no chest pain, no nausea, no vomiting, no abdominal pain.  Foley catheter in place; tea colored urine appreciated.  Decreasing urine output noticed.  Overnight reported nausea and epigastric discomfort for reflux.  Objective: Vitals:   03/02/19 2255 03/03/19 0451 03/03/19 0823 03/03/19 1433  BP: 129/67 136/65 134/71 107/64  Pulse:  66 72 62  Resp: 20 20 16 16   Temp: 98.4 F (36.9 C) (!) 97.4 F (36.3 C)    TempSrc: Oral Oral    SpO2: 94% 95% 92% 91%  Weight:      Height:        Intake/Output Summary (Last 24 hours) at 03/03/2019 1454 Last data filed at 03/02/2019 2300 Gross per 24 hour  Intake 1233.61 ml  Output 250 ml  Net 983.61 ml   Weight change:   Exam: General exam: Alert, awake and afebrile; denies shortness of breath, chest pain, nausea and vomiting.  Decreased urine output appreciated  by nurse; and the same topic diuresis improvement in her hematuria. Respiratory system: Clear to auscultation. Respiratory effort normal. Cardiovascular system:RRR. No murmurs, rubs, gallops. Gastrointestinal system: Abdomen is nondistended, soft and nontender. No organomegaly or masses felt. Normal bowel sounds heard. Central nervous system: Alert  and oriented. No focal neurological deficits. Extremities: No C/C/E, +pedal pulses Skin: No rashes, lesions or ulcers Psychiatry: Judgement and insight appear normal. Mood & affect appropriate.    Data Reviewed: I have personally reviewed following labs and imaging studies  Basic Metabolic Panel: Recent Labs  Lab 03/01/19 1453 03/02/19 0449  NA 138 142  K 4.3 4.0  CL 106 109  CO2 24 25  GLUCOSE 110* 87  BUN 21 18  CREATININE 0.74 0.56  CALCIUM 9.8 9.4   Liver Function Tests: Recent Labs  Lab 03/01/19 1453  AST 24  ALT 15  ALKPHOS 96  BILITOT 0.7  PROT 6.8  ALBUMIN 3.6   CBC: Recent Labs  Lab 03/01/19 1453 03/02/19 0449 03/03/19 0523  WBC 5.0 4.1 11.6*  NEUTROABS 3.2  --   --   HGB 10.8* 9.9* 11.4*  HCT 36.0 32.8* 37.0  MCV 88.9 89.1 87.5  PLT 187 163 228   Urine analysis:    Component Value Date/Time   COLORURINE RED (A) 03/01/2019 1309   APPEARANCEUR TURBID (A) 03/01/2019 1309   LABSPEC  03/01/2019 1309    TEST NOT REPORTED DUE TO COLOR INTERFERENCE OF URINE PIGMENT   PHURINE  03/01/2019 1309    TEST NOT REPORTED DUE TO COLOR INTERFERENCE OF URINE PIGMENT   GLUCOSEU (A) 03/01/2019 1309    TEST NOT REPORTED DUE TO COLOR INTERFERENCE OF URINE PIGMENT   HGBUR (A) 03/01/2019 1309    TEST NOT REPORTED DUE TO COLOR INTERFERENCE OF URINE PIGMENT   BILIRUBINUR (A) 03/01/2019 1309    TEST NOT REPORTED DUE TO COLOR INTERFERENCE OF URINE PIGMENT   KETONESUR (A) 03/01/2019 1309    TEST NOT REPORTED DUE TO COLOR INTERFERENCE OF URINE PIGMENT   PROTEINUR (A) 03/01/2019 1309    TEST NOT REPORTED DUE TO COLOR INTERFERENCE OF URINE PIGMENT   NITRITE (A) 03/01/2019 1309    TEST NOT REPORTED DUE TO COLOR INTERFERENCE OF URINE PIGMENT   LEUKOCYTESUR (A) 03/01/2019 1309    TEST NOT REPORTED DUE TO COLOR INTERFERENCE OF URINE PIGMENT    Recent Results (from the past 240 hour(s))  Urine culture     Status: Abnormal (Preliminary result)   Collection Time: 03/01/19   1:09 PM   Specimen: Urine, Random  Result Value Ref Range Status   Specimen Description   Final    URINE, RANDOM Performed at [facility], [address] [place], [place] [address]    Special Requests   Final    NONE Performed at [facility], [address] [place], [place] [address]    Culture (A)  Final    10,000 COLONIES/mL STAPHYLOCOCCUS AUREUS SUSCEPTIBILITIES TO FOLLOW Performed at [facility], [place] [address], [place], [place] [address]    Report Status PENDING  Incomplete  SARS CORONAVIRUS 2 (TAT 6-24 HRS) Nasopharyngeal Nasopharyngeal Swab     Status: None   Collection Time: 03/01/19 10:41 PM   Specimen: Nasopharyngeal Swab  Result Value Ref Range Status   SARS Coronavirus 2 NEGATIVE NEGATIVE Final    Comment: (NOTE) SARS-CoV-2 target nucleic acids are NOT DETECTED. The SARS-CoV-2 RNA is generally detectable in upper and lower respiratory specimens during the acute phase of infection.  Negative results do not preclude SARS-CoV-2 infection, do not rule out co-infections with other pathogens, and should not be used as the sole basis for treatment or other patient management decisions. Negative results must be combined with clinical observations, patient history, and epidemiological information. The expected result is Negative. Fact Sheet for Patients: SugarRoll.be Fact Sheet for Healthcare Providers: https://www.woods-mathews.com/ This test is not yet approved or cleared by the [place] FDA and  has been authorized for detection and/or diagnosis of SARS-CoV-2 by FDA under an Emergency Use Authorization (EUA). This EUA will remain  in effect (meaning this test can be used) for the duration of the COVID-19 declaration under Section 56 4(b)(1) of the Act, 21 U.S.C. section 360bbb-3(b)(1), unless the authorization is terminated or revoked sooner. Performed at [facility], [place] [address] [place],  [place] [address]      Scheduled Meds:  Chlorhexidine Gluconate Cloth  6 each Topical Daily   digoxin  0.125 mg Oral Daily   levothyroxine  112 mcg Oral QAC breakfast   pantoprazole  40 mg Oral Daily   sodium chloride flush  3 mL Intravenous Q12H   Continuous Infusions:  sodium chloride     lactated ringers 75 mL/hr at 03/02/19 1525    Procedures/Studies: Ct Abdomen Pelvis W Contrast  Result Date: 03/01/2019 CLINICAL DATA:  Hematuria, abdominal distension EXAM: CT ABDOMEN AND PELVIS WITH CONTRAST TECHNIQUE: Multidetector CT imaging of the abdomen and pelvis was performed using the standard protocol following bolus administration of intravenous contrast. CONTRAST:  23mL OMNIPAQUE IOHEXOL 300 MG/ML  SOLN COMPARISON:  None. FINDINGS: Motion degraded images. Lower chest: Emphysematous changes with bronchiectasis in the right middle lobe, lingula, and bilateral lower lobes. 8 mm medial right lower lobe nodule (series 4/image 4), grossly unchanged from 2007, benign. Hepatobiliary: Liver is within normal limits. Multiple gallstones, without associated inflammatory changes. No intrahepatic or extrahepatic ductal dilatation. Pancreas: Within normal limits. Spleen: Within normal limits. Adrenals/Urinary Tract: Adrenal glands are within normal limits. Kidneys are grossly unremarkable.  No hydronephrosis. Large left inferolateral bladder mass measuring at least 6.4 x 3.9 cm (series 2/image 70), suspicious for primary bladder neoplasm. Additional 1.2 x 2.8 cm lesion along the right anterior bladder (series 2/image 69), also worrisome for tumor. Stomach/Bowel: Stomach is within normal limits. No evidence of bowel obstruction. Appendix is not discretely visualized. Vascular/Lymphatic: No evidence of abdominal aortic aneurysm. Atherosclerotic calcifications of the abdominal aorta and branch vessels. No suspicious abdominopelvic lymphadenopathy. Reproductive: Uterus is within normal limits. No adnexal masses,  noting streak artifact obscures the left pelvis. Other: No abdominopelvic ascites. Musculoskeletal: Moderate to severe compression fracture deformities at T11 and L1 (sagittal image 41), unchanged from 2015. Mild superior endplate compression fracture deformity at L3, also unchanged. No retropulsion. Left hip arthroplasty, without evidence of complication. IMPRESSION: Motion degraded images. Multifocal bladder masses, suspicious for primary bladder neoplasm. Urology consultation is suggested for cystoscopy if tissue diagnosis is desired. No findings specific for synchronous or metastatic disease. 8 mm medial right lower lobe nodule, grossly unchanged from 2007, benign. Cholelithiasis, without associated inflammatory changes. Electronically Signed   By: Julian Hy M.D.   On: 03/01/2019 17:18   Dg Knee Complete 4 Views Right  Result Date: 03/01/2019 CLINICAL DATA:  Fall 6 weeks prior with pain in right knee EXAM: RIGHT KNEE - COMPLETE 4+ VIEW COMPARISON:  None. FINDINGS: The osseous structures appear diffusely demineralized which may limit detection of small or nondisplaced fractures. There is a questionable avulsion type fracture of  the lateral tibial spine. Mild tricompartmental degenerative changes are present. Medial and lateral meniscal chondrocalcinosis is noted. No sizeable joint effusion. Atherosclerotic calcifications noted posterior to the knee. IMPRESSION: 1. Questionable avulsion fracture of the lateral tibial spine. 2. Diffuse osseous demineralization. 3. Tricompartmental degenerative changes with medial and lateral meniscal chondrocalcinosis may reflect underlying calcium pyrophosphate deposition. Electronically Signed   By: Lovena Le M.D.   On: 03/01/2019 17:17    Barton Dubois, MD  [facility] Pager [phone]  03/03/2019, 2:54 PM   LOS: 2 days

## 2019-03-03 NOTE — Progress Notes (Signed)
RN contacted by primary RN about minimal urine output throughout the shift (50 ccs). Pt had 67 F foley catheter placed early this morning for bladder outlet obstruction and hematuria. Attempted to flush foley with 50 mls sterile water. Water was easy to instill, but RN only able to remove 10-15 mls of instilled water, instillation process repeated 3 times with same outcome. Leaking noted around the foley with each flush. Attempted to replace foley with same size catheter no urine return upon insertion. Resistance met while attempted to fill the 10 ml catheter balloon, even after repositioning the tube. After unsuccessful attempt, RN able to pass a 14 French foley catheter with successful urine return and able to flush easily and able to remove same quantity of instilled water without resistance and not obvious leaking around catheter. Urologist on call (Dr Matilde Sprang) notified, no orders given at this time. Will continue to monitor urine output. Pt tolerated procedure well.

## 2019-03-03 NOTE — Progress Notes (Signed)
Pt called this RN to her room due to leakage around her foley and sudden onset nausea, vomiting, chest pain, and diaphoresis. Obtained orders for STAT EKG. Pt is not experiencing any shortness of breath. Pain is not radiating, no description by patient other than it "hurts like a dog." Pain is more in the  epigastric area. Administered PRN zofran. Pt is currently sitting up in bed belching and very nauseas. Will continue to monitor patient.

## 2019-03-03 NOTE — Progress Notes (Signed)
Administered PRN tramadol 50 mg q8 hrs at 0155. Pt slept for two hours. She has been moaning from the same epigastric pain and is now asking for more pain medicine. Paged MD for orders. Will continue to assess.

## 2019-03-04 DIAGNOSIS — E538 Deficiency of other specified B group vitamins: Secondary | ICD-10-CM

## 2019-03-04 DIAGNOSIS — N3289 Other specified disorders of bladder: Secondary | ICD-10-CM

## 2019-03-04 LAB — URINE CULTURE: Culture: 10000 — AB

## 2019-03-04 LAB — GLUCOSE, CAPILLARY
Glucose-Capillary: 80 mg/dL (ref 70–99)
Glucose-Capillary: 89 mg/dL (ref 70–99)

## 2019-03-04 MED ORDER — DOXYCYCLINE HYCLATE 100 MG PO TABS: 100.0000 mg | ORAL_TABLET | Freq: Two times a day (BID) | ORAL | 0 refills | Status: DC

## 2019-03-04 MED ORDER — PANTOPRAZOLE SODIUM 40 MG PO TBEC: 40.0000 mg | DELAYED_RELEASE_TABLET | Freq: Every day | ORAL | 1 refills | Status: DC

## 2019-03-04 NOTE — TOC Initial Note (Signed)
Transition of Care [facility]) - Initial/Assessment Note    Patient Details  Name: Brandy Fitzpatrick MRN: [id number] Date of Birth: 01/08/1920  Transition of [facility]) CM/SW Contact:    [place], LCSW Phone Number: 03/04/2019, 4:01 PM  Clinical Narrative:    CSW conducting TOC assessment with pts daughter Oswaldo Done. Marlowe Kays explains that patient resides at home alone but she will be assisting patient in the home for the remainder of the week.   Pt currently has the following DME currently in the home: BSC, walker, and cane. Pt was referred to [facility] for referral for PT/RN. Vaughan Basta from [facility] informed CSW that services would not be able to be initiated until December 16. CSW informed family of this and family is agreeable.   Pt will be discharging home with supervision from daughter on 03/04/2019 while being followed by [facility]  Transportation home will be provided by family.   [place] Transitions of Care  Clinical Social Worker  Ph: [phone]      Expected Discharge Plan: [place] Barriers to Discharge: No Barriers Identified   Patient Goals and CMS Choice Patient states their goals for this hospitalization and ongoing recovery are:: to discharge home CMS Medicare.gov Compare Post Acute Care list provided to:: Patient Represenative (must comment)([facility]: [phone]) Choice offered to / list presented to : Adult Children  Expected Discharge Plan and Services Expected Discharge Plan: [place] In-house Referral: Clinical Social Work Discharge Planning Services: CM Consult Post Acute Care Choice: [place] arrangements for the past 2 months: [place] Expected Discharge Date: 03/04/19                         HH Arranged: RN, PT [place] Agency: [place] ([place]) Date [place]: 03/04/19 Time [place]: 1558 Representative spoke with at [place]:  [id number] [phone]  Prior Living Arrangements/Services Living arrangements for the past 2 months: [place] with:: Self Patient language and need for interpreter reviewed:: Yes Do you feel safe going back to the place where you live?: Yes      Need for Family Participation in Patient Care: Yes (Comment) Care giver support system in place?: Yes (comment) Current home services: DME Criminal Activity/Legal Involvement Pertinent to Current Situation/Hospitalization: No - Comment as needed  Activities of Daily Living Home Assistive Devices/Equipment: [place]ford Rile (specify type) ADL Screening (condition at time of admission) Patient's cognitive ability adequate to safely complete daily activities?: Yes Is the patient deaf or have difficulty hearing?: Yes Does the patient have difficulty seeing, even when wearing glasses/contacts?: Yes Does the patient have difficulty concentrating, remembering, or making decisions?: No Patient able to express need for assistance with ADLs?: Yes Does the patient have difficulty dressing or bathing?: No Independently performs ADLs?: Yes (appropriate for developmental age) Does the patient have difficulty walking or climbing stairs?: Yes Weakness of Legs: Both Weakness of Arms/Hands: Both  Permission Sought/Granted Permission sought to share information with : Facility Art therapist granted to share information with : Yes, Verbal Permission Granted  Share Information with NAME: Oswaldo Done  Permission granted to share info w AGENCY: [place] granted to share info w Relationship: Daughter  Permission granted to share info w Contact Information: PH:[phone]  Emotional Assessment   Attitude/Demeanor/Rapport: Unable to Assess Affect (typically observed): Unable to Assess Orientation: : Oriented to  Self, Oriented to Place, Oriented to  Time, Oriented to Situation Alcohol / Substance Use:  Not Applicable Psych Involvement: No (comment)  Admission diagnosis:  Blood in Urine, Weakness Patient Active Problem List   Diagnosis Date Noted  . Bladder mass   . Acute blood loss anemia 03/02/2019  . Hematuria 03/01/2019  . Non-rheumatic mitral regurgitation 05/21/2015  . Vitamin B 12 deficiency 05/18/2015  . Acquired hypothyroidism 05/16/2015   PCP:  Loman Brooklyn, FNP Pharmacy:   [facility], [place] [address] Phone: [phone] Fax: [phone]     Social Determinants of Health (SDOH) Interventions    Readmission Risk Interventions Readmission Risk Prevention Plan 03/04/2019  Post Dischage Appt Not Complete  Appt Comments currently attempting to schedule appointment  Medication Screening Complete  Transportation Screening Complete  Some recent data might be hidden

## 2019-03-04 NOTE — TOC Transition Note (Signed)
Transition of Care [facility]) - CM/SW Discharge Note   Patient Details  Name: Brandy Fitzpatrick MRN: [id number] Date of Birth: December 20, 1919  [facility] CM/SW Contact:  [place], LCSW Phone Number: 03/04/2019, 4:12 PM   Clinical Narrative:     Follow up appointment with PCP scheduled for 03/20/2019 at 10:30 am. Pt to be transported home by family.  CSW signing off  [place] Transitions of Care  Clinical Social Worker  Ph: [phone]    Final next level of care: [place] Barriers to Discharge: No Barriers Identified   Patient Goals and CMS Choice Patient states their goals for this hospitalization and ongoing recovery are:: To discharge home followed by [place] CMS Medicare.gov Compare Post Acute Care list provided to:: Patient Represenative (must comment) Choice offered to / list presented to : Adult Children  Discharge Placement                    Patient and family notified of of transfer: 03/04/19  Discharge Plan and Services In-house Referral: Clinical Social Work Discharge Planning Services: CM Consult Post Acute Care Choice: Home Health                    HH Arranged: PT, RN [facility] Agency: [place] ([place]) Date [place]: 03/04/19 Time [place]: 1612 Representative spoke with at [place]: Wyn For[facility]: [phone]  Social Determinants of Health ([place]) Interventions     Readmission Risk Interventions Readmission Risk Prevention Plan 03/04/2019  Post Dischage Appt Not Complete  Appt Comments currently attempting to schedule appointment  Medication Screening Complete  Transportation Screening Complete  Some recent data might be hidden

## 2019-03-04 NOTE — Progress Notes (Signed)
Pt and pt's daughter Marlowe Kays given discharge instructions with understanding. No questions at this time. Pt and pt's daughter given instructions on foley care and exchange of drainage bag and leg bag. Pt and pt's daughter have no questions. IV d/c. Pt wheeled out via wheelchair.

## 2019-03-04 NOTE — Discharge Summary (Signed)
Physician Discharge Summary  Brandy Fitzpatrick [id number] DOB: 28-Oct-1919 DOA: 03/01/2019  PCP: Loman Brooklyn, FNP  Admit date: 03/01/2019 Discharge date: 03/04/2019  Time spent: 35 minutes  Recommendations for Outpatient Follow-up:  1. Repeat CBC to follow hemoglobin trend 2. Repeat basic metabolic panel to upper electrolytes and renal function.   Discharge Diagnoses:  Principal Problem:   Hematuria Active Problems:   Acquired hypothyroidism   Non-rheumatic mitral regurgitation   Vitamin B 12 deficiency   Acute blood loss anemia   Bladder mass   Discharge Condition: Stable and improved.  Patient discharged home with instruction to follow-up with PCP in 2 weeks and to follow-up with urologist service on 03/10/2019.  Foley catheter has remained in place at time of discharge.  Diet recommendation: Regular diet.  Filed Weights   03/01/19 1305 03/02/19 0103 03/02/19 0546  Weight: 51.3 kg 48.2 kg 48.2 kg    History of present illness:  85 year old female with a history of hypothyroidism, B12 deficiency presenting with gross hematuria.  The patient states that she has had intermittent gross hematuria since May 2020.  She denies any fevers, chills, chest pain, shortness breath, dizziness, hematochezia, melena.  She states that she does have intermittent dysuria and suprapubic pain.  The patient has been on 3 separate antibiotic courses for the last 3 months.  Apparently, her hematuria resolves while she is on antibiotics, but returns after she is finished.  Most recently, the patient finished a course of antibiotics on 02/20/2019.  She does not recall the name of the antibiotics.  She went to see urology on 02/09/2019, and they scheduled a CT of the abdomen and pelvis which was supposed to be done on 03/02/2019.  Nevertheless, the patient has had worsening constant gross hematuria for the past week.  As result she presented for further evaluation.  CT of the abdomen and pelvis in the ED  showed a large inferolateral bladder mass 6.4 x 3.9 cm suspicious for bladder neoplasm.  There was also an anterior bladder mass measuring 1.2 x 2.8 cm.  The patient had low-grade temperature of 99.4 F but was hemodynamically stable.  Hemoglobin was 10.8 in the emergency department with platelets 187,000 and WBC 4.1.  UA showed 6-10 WBC and >50 RBC.  Hospital Course:  Gross hematuria -Secondary to bladder masses -Urology consult--failed patient not exactly a great candidate for surgical intervention.   -Family in agreement to pursuit conservative management only. -Continue Foley catheter and follow up with urology service on 03/10/2019 as an outpatient.  More than 100,000 colonies of staph areas in urine culture -Afebrile and denying dysuria -Patient will be treated with 7 days of doxycycline -Follow-up with urology service as an outpatient.  Right tibial spine avulsion fracture -03/01/2019 right knee x-ray--lateral tibial spine avulsion fx -I discussed with ortho, Dr. Lenna Sciara. Rogers-->nonoperative management, WBAT -Continue supportive care and as needed analgesics.  Acute blood loss anemia -Hemoglobin has dropped from 11.5 (01/30/2019) to 9.9 -Repeat hemoglobin 11.4 and is stable.. -Recommending repeat CBC at follow-up visit to reassess hemoglobin trend.  Hypothyroidism -Continue levothyroxine  Digoxin usage -Patient does not know why she is on digoxin which she has taken for 30 years -Per daughter report she feels well secondary to paroxysmal A. Fib. -No frank documentation in her records for that.  Procedures:  See below for x-ray reports.  Consultations:  Urology service  Discharge Exam: Vitals:   03/04/19 0701 03/04/19 1327  BP: (!) 93/52 (!) 112/56  Pulse: 75 74  Resp:  18  Temp: 98.5 F (36.9 C) 98.2 F (36.8 C)  SpO2: 92% 97%   General exam: Alert, awake and afebrile; denies shortness of breath, chest pain, nausea and vomiting.  Decreased urine output  appreciated overnight, but improved after new 14 french foley catheter placed. No dysuria and nor frank hematuria seen.  Respiratory system: Clear to auscultation. Respiratory effort normal. Cardiovascular system:RRR. No murmurs, rubs, gallops. Gastrointestinal system: Abdomen is nondistended, soft and nontender. No organomegaly or masses felt. Normal bowel sounds heard. Central nervous system: Alert and oriented. No focal neurological deficits. Extremities: No C/C/E, +pedal pulses Skin: No rashes, lesions or ulcers Psychiatry: Judgement and insight appear normal. Mood & affect appropriate.   Discharge Instructions   Discharge Instructions    Discharge instructions   Complete by: As directed    Take medications as prescribed Complete antibiotic therapy as instructed Foley catheter to stay in place until follow-up with urology service as an outpatient -Follow-up with Dr. Gloriann Loan (urology service; office will contact you with appointment details).     Allergies as of 03/04/2019      Reactions   Codeine Rash   Propoxyphene Rash   Sulfa Antibiotics Rash      Medication List    TAKE these medications   aspirin 81 MG chewable tablet Chew 81 mg by mouth daily.   digoxin 0.125 MG tablet Commonly known as: LANOXIN Take 1 tablet (0.125 mg total) by mouth daily.   doxycycline 100 MG tablet Commonly known as: VIBRA-TABS Take 1 tablet (100 mg total) by mouth 2 (two) times daily for 7 days.   levothyroxine 112 MCG tablet Commonly known as: SYNTHROID Take 1 tablet (112 mcg total) by mouth daily before breakfast.   pantoprazole 40 MG tablet Commonly known as: PROTONIX Take 1 tablet (40 mg total) by mouth daily. Start taking on: March 05, 2019   Systane Balance 0.6 % Soln Generic drug: Propylene Glycol Apply 1-2 drops to eye 4 (four) times daily as needed.   triamcinolone cream 0.1 % Commonly known as: KENALOG Apply 1 application topically 2 (two) times daily. What changed:    when to take this  reasons to take this      Allergies  Allergen Reactions  . Codeine Rash  . Propoxyphene Rash  . Sulfa Antibiotics Rash   Follow-up Information    Brandy Mallow, MD On 03/10/2019.   Specialty: Urology Why: Follow-up at 10:00 AM Contact information: [place] [id number] [place], [place], FNP. Schedule an appointment as soon as possible for a visit in 2 week(s).   Specialty: Family Medicine Contact information: [place] [address] [phone]           The results of significant diagnostics from this hospitalization (including imaging, microbiology, ancillary and laboratory) are listed below for reference.    Significant Diagnostic Studies: CT ABDOMEN PELVIS W CONTRAST  Result Date: 03/01/2019 CLINICAL DATA:  Hematuria, abdominal distension EXAM: CT ABDOMEN AND PELVIS WITH CONTRAST TECHNIQUE: Multidetector CT imaging of the abdomen and pelvis was performed using the standard protocol following bolus administration of intravenous contrast. CONTRAST:  79mL OMNIPAQUE IOHEXOL 300 MG/ML  SOLN COMPARISON:  None. FINDINGS: Motion degraded images. Lower chest: Emphysematous changes with bronchiectasis in the right middle lobe, lingula, and bilateral lower lobes. 8 mm medial right lower lobe nodule (series 4/image 4), grossly unchanged from 2007, benign. Hepatobiliary: Liver is within normal limits. Multiple gallstones,  without associated inflammatory changes. No intrahepatic or extrahepatic ductal dilatation. Pancreas: Within normal limits. Spleen: Within normal limits. Adrenals/Urinary Tract: Adrenal glands are within normal limits. Kidneys are grossly unremarkable.  No hydronephrosis. Large left inferolateral bladder mass measuring at least 6.4 x 3.9 cm (series 2/image 70), suspicious for primary bladder neoplasm. Additional 1.2 x 2.8 cm lesion along the right anterior bladder (series 2/image 69),  also worrisome for tumor. Stomach/Bowel: Stomach is within normal limits. No evidence of bowel obstruction. Appendix is not discretely visualized. Vascular/Lymphatic: No evidence of abdominal aortic aneurysm. Atherosclerotic calcifications of the abdominal aorta and branch vessels. No suspicious abdominopelvic lymphadenopathy. Reproductive: Uterus is within normal limits. No adnexal masses, noting streak artifact obscures the left pelvis. Other: No abdominopelvic ascites. Musculoskeletal: Moderate to severe compression fracture deformities at T11 and L1 (sagittal image 41), unchanged from 2015. Mild superior endplate compression fracture deformity at L3, also unchanged. No retropulsion. Left hip arthroplasty, without evidence of complication. IMPRESSION: Motion degraded images. Multifocal bladder masses, suspicious for primary bladder neoplasm. Urology consultation is suggested for cystoscopy if tissue diagnosis is desired. No findings specific for synchronous or metastatic disease. 8 mm medial right lower lobe nodule, grossly unchanged from 2007, benign. Cholelithiasis, without associated inflammatory changes. Electronically Signed   By: Julian Hy M.D.   On: 03/01/2019 17:18   DG Knee Complete 4 Views Right  Result Date: 03/01/2019 CLINICAL DATA:  Fall 6 weeks prior with pain in right knee EXAM: RIGHT KNEE - COMPLETE 4+ VIEW COMPARISON:  None. FINDINGS: The osseous structures appear diffusely demineralized which may limit detection of small or nondisplaced fractures. There is a questionable avulsion type fracture of the lateral tibial spine. Mild tricompartmental degenerative changes are present. Medial and lateral meniscal chondrocalcinosis is noted. No sizeable joint effusion. Atherosclerotic calcifications noted posterior to the knee. IMPRESSION: 1. Questionable avulsion fracture of the lateral tibial spine. 2. Diffuse osseous demineralization. 3. Tricompartmental degenerative changes with medial and  lateral meniscal chondrocalcinosis may reflect underlying calcium pyrophosphate deposition. Electronically Signed   By: Lovena Le M.D.   On: 03/01/2019 17:17    Microbiology: Recent Results (from the past 240 hour(s))  Urine culture     Status: Abnormal   Collection Time: 03/01/19  1:09 PM   Specimen: Urine, Random  Result Value Ref Range Status   Specimen Description   Final    URINE, RANDOM Performed at [facility], [address] [place], [place] [address]    Special Requests   Final    NONE Performed at [facility], [address] [place],  [address]    Culture (A)  Final    10,000 COLONIES/mL METHICILLIN RESISTANT STAPHYLOCOCCUS AUREUS   Report Status 03/04/2019 FINAL  Final   Organism ID, Bacteria METHICILLIN RESISTANT STAPHYLOCOCCUS AUREUS (A)  Final      Susceptibility   Methicillin resistant staphylococcus aureus - MIC*    CIPROFLOXACIN >=8 RESISTANT Resistant     GENTAMICIN <=0.5 SENSITIVE Sensitive     NITROFURANTOIN <=16 SENSITIVE Sensitive     OXACILLIN >=4 RESISTANT Resistant     TETRACYCLINE <=1 SENSITIVE Sensitive     VANCOMYCIN 1 SENSITIVE Sensitive     TRIMETH/SULFA <=10 SENSITIVE Sensitive     CLINDAMYCIN <=0.25 SENSITIVE Sensitive     RIFAMPIN <=0.5 SENSITIVE Sensitive     Inducible Clindamycin NEGATIVE Sensitive     * 10,000 COLONIES/mL METHICILLIN RESISTANT STAPHYLOCOCCUS AUREUS  SARS CORONAVIRUS 2 (TAT 6-24 HRS) Nasopharyngeal Nasopharyngeal Swab     Status: None  Collection Time: 03/01/19 10:41 PM   Specimen: Nasopharyngeal Swab  Result Value Ref Range Status   SARS Coronavirus 2 NEGATIVE NEGATIVE Final    Comment: (NOTE) SARS-CoV-2 target nucleic acids are NOT DETECTED. The SARS-CoV-2 RNA is generally detectable in upper and lower respiratory specimens during the acute phase of infection. Negative results do not preclude SARS-CoV-2 infection, do not rule out co-infections with other pathogens, and should not be used as the sole basis  for treatment or other patient management decisions. Negative results must be combined with clinical observations, patient history, and epidemiological information. The expected result is Negative. Fact Sheet for Patients: SugarRoll.be Fact Sheet for Healthcare Providers: https://www.woods-mathews.com/ This test is not yet approved or cleared by the [place] FDA and  has been authorized for detection and/or diagnosis of SARS-CoV-2 by FDA under an Emergency Use Authorization (EUA). This EUA will remain  in effect (meaning this test can be used) for the duration of the COVID-19 declaration under Section 56 4(b)(1) of the Act, 21 U.S.C. section 360bbb-3(b)(1), unless the authorization is terminated or revoked sooner. Performed at [facility], [place] [address], [place], [place] [address]      Labs: Basic Metabolic Panel: Recent Labs  Lab 03/01/19 1453 03/02/19 0449  NA 138 142  K 4.3 4.0  CL 106 109  CO2 24 25  GLUCOSE 110* 87  BUN 21 18  CREATININE 0.74 0.56  CALCIUM 9.8 9.4   Liver Function Tests: Recent Labs  Lab 03/01/19 1453  AST 24  ALT 15  ALKPHOS 96  BILITOT 0.7  PROT 6.8  ALBUMIN 3.6   CBC: Recent Labs  Lab 03/01/19 1453 03/02/19 0449 03/03/19 0523  WBC 5.0 4.1 11.6*  NEUTROABS 3.2  --   --   HGB 10.8* 9.9* 11.4*  HCT 36.0 32.8* 37.0  MCV 88.9 89.1 87.5  PLT 187 163 228   CBG: Recent Labs  Lab 03/03/19 1603 03/03/19 2245 03/04/19 0756 03/04/19 1146  GLUCAP 145* 113* 80 89    Signed:  Barton Dubois MD.  [facility] 03/04/2019, 2:39 PM

## 2019-03-06 ENCOUNTER — Inpatient Hospital Stay (HOSPITAL_COMMUNITY)
Admission: EM | Admit: 2019-03-06 | Discharge: 2019-03-11 | DRG: 439 | Disposition: A | Discharge status: Hospice | Payer: Medicare Other | Attending: Family Medicine | Admitting: Family Medicine

## 2019-03-06 ENCOUNTER — Emergency Department (HOSPITAL_COMMUNITY): Payer: Medicare Other

## 2019-03-06 ENCOUNTER — Other Ambulatory Visit: Payer: Self-pay

## 2019-03-06 ENCOUNTER — Encounter (HOSPITAL_COMMUNITY): Payer: Self-pay | Admitting: Emergency Medicine

## 2019-03-06 DIAGNOSIS — H9190 Unspecified hearing loss, unspecified ear: Secondary | ICD-10-CM | POA: Diagnosis present

## 2019-03-06 DIAGNOSIS — K8020 Calculus of gallbladder without cholecystitis without obstruction: Secondary | ICD-10-CM | POA: Diagnosis not present

## 2019-03-06 DIAGNOSIS — Z7189 Other specified counseling: Secondary | ICD-10-CM

## 2019-03-06 DIAGNOSIS — K859 Acute pancreatitis without necrosis or infection, unspecified: Secondary | ICD-10-CM | POA: Diagnosis present

## 2019-03-06 DIAGNOSIS — C679 Malignant neoplasm of bladder, unspecified: Secondary | ICD-10-CM | POA: Diagnosis present

## 2019-03-06 DIAGNOSIS — R52 Pain, unspecified: Secondary | ICD-10-CM

## 2019-03-06 DIAGNOSIS — Z820 Family history of epilepsy and other diseases of the nervous system: Secondary | ICD-10-CM

## 2019-03-06 DIAGNOSIS — Z7982 Long term (current) use of aspirin: Secondary | ICD-10-CM

## 2019-03-06 DIAGNOSIS — Z823 Family history of stroke: Secondary | ICD-10-CM | POA: Diagnosis not present

## 2019-03-06 DIAGNOSIS — Z79890 Hormone replacement therapy (postmenopausal): Secondary | ICD-10-CM | POA: Diagnosis not present

## 2019-03-06 DIAGNOSIS — R310 Gross hematuria: Secondary | ICD-10-CM | POA: Diagnosis present

## 2019-03-06 DIAGNOSIS — R7401 Elevation of levels of liver transaminase levels: Secondary | ICD-10-CM

## 2019-03-06 DIAGNOSIS — R918 Other nonspecific abnormal finding of lung field: Secondary | ICD-10-CM | POA: Diagnosis present

## 2019-03-06 DIAGNOSIS — K5900 Constipation, unspecified: Secondary | ICD-10-CM | POA: Diagnosis present

## 2019-03-06 DIAGNOSIS — D638 Anemia in other chronic diseases classified elsewhere: Secondary | ICD-10-CM | POA: Diagnosis present

## 2019-03-06 DIAGNOSIS — Z515 Encounter for palliative care: Secondary | ICD-10-CM | POA: Diagnosis not present

## 2019-03-06 DIAGNOSIS — I340 Nonrheumatic mitral (valve) insufficiency: Secondary | ICD-10-CM | POA: Diagnosis present

## 2019-03-06 DIAGNOSIS — Z20828 Contact with and (suspected) exposure to other viral communicable diseases: Secondary | ICD-10-CM | POA: Diagnosis present

## 2019-03-06 DIAGNOSIS — K8050 Calculus of bile duct without cholangitis or cholecystitis without obstruction: Secondary | ICD-10-CM

## 2019-03-06 DIAGNOSIS — S82111D Displaced fracture of right tibial spine, subsequent encounter for closed fracture with routine healing: Secondary | ICD-10-CM | POA: Diagnosis not present

## 2019-03-06 DIAGNOSIS — I248 Other forms of acute ischemic heart disease: Secondary | ICD-10-CM | POA: Diagnosis present

## 2019-03-06 DIAGNOSIS — K8070 Calculus of gallbladder and bile duct without cholecystitis without obstruction: Secondary | ICD-10-CM | POA: Diagnosis present

## 2019-03-06 DIAGNOSIS — K851 Biliary acute pancreatitis without necrosis or infection: Principal | ICD-10-CM | POA: Diagnosis present

## 2019-03-06 DIAGNOSIS — Z8249 Family history of ischemic heart disease and other diseases of the circulatory system: Secondary | ICD-10-CM | POA: Diagnosis not present

## 2019-03-06 DIAGNOSIS — E039 Hypothyroidism, unspecified: Secondary | ICD-10-CM | POA: Diagnosis present

## 2019-03-06 DIAGNOSIS — R54 Age-related physical debility: Secondary | ICD-10-CM | POA: Diagnosis present

## 2019-03-06 DIAGNOSIS — B9561 Methicillin susceptible Staphylococcus aureus infection as the cause of diseases classified elsewhere: Secondary | ICD-10-CM | POA: Diagnosis present

## 2019-03-06 DIAGNOSIS — X58XXXD Exposure to other specified factors, subsequent encounter: Secondary | ICD-10-CM | POA: Diagnosis not present

## 2019-03-06 DIAGNOSIS — H5461 Unqualified visual loss, right eye, normal vision left eye: Secondary | ICD-10-CM | POA: Diagnosis present

## 2019-03-06 DIAGNOSIS — N390 Urinary tract infection, site not specified: Secondary | ICD-10-CM | POA: Diagnosis present

## 2019-03-06 DIAGNOSIS — Z66 Do not resuscitate: Secondary | ICD-10-CM | POA: Diagnosis present

## 2019-03-06 DIAGNOSIS — Z96642 Presence of left artificial hip joint: Secondary | ICD-10-CM | POA: Diagnosis present

## 2019-03-06 DIAGNOSIS — E538 Deficiency of other specified B group vitamins: Secondary | ICD-10-CM | POA: Diagnosis present

## 2019-03-06 HISTORY — DX: Disorder of kidney and ureter, unspecified: N28.9

## 2019-03-06 LAB — COMPREHENSIVE METABOLIC PANEL
ALT: 80 U/L — ABNORMAL HIGH (ref 0–44)
AST: 160 U/L — ABNORMAL HIGH (ref 15–41)
Albumin: 2.9 g/dL — ABNORMAL LOW (ref 3.5–5.0)
Alkaline Phosphatase: 206 U/L — ABNORMAL HIGH (ref 38–126)
Anion gap: 6 (ref 5–15)
BUN: 17 mg/dL (ref 8–23)
CO2: 24 mmol/L (ref 22–32)
Calcium: 9.1 mg/dL (ref 8.9–10.3)
Chloride: 104 mmol/L (ref 98–111)
Creatinine, Ser: 0.62 mg/dL (ref 0.44–1.00)
GFR calc Af Amer: >60 mL/min (ref >60)
GFR calc non Af Amer: >60 mL/min (ref >60)
Glucose, Bld: 132 mg/dL — ABNORMAL HIGH (ref 70–99)
Potassium: 3.5 mmol/L (ref 3.5–5.1)
Sodium: 134 mmol/L — ABNORMAL LOW (ref 135–145)
Total Bilirubin: 2.3 mg/dL — ABNORMAL HIGH (ref 0.3–1.2)
Total Protein: 6.2 g/dL — ABNORMAL LOW (ref 6.5–8.1)

## 2019-03-06 LAB — CBC WITH DIFFERENTIAL/PLATELET
Abs Immature Granulocytes: 0.03 10*3/uL (ref 0.00–0.07)
Basophils Absolute: 0 10*3/uL (ref 0.0–0.1)
Basophils Relative: 0 %
Eosinophils Absolute: 0.1 10*3/uL (ref 0.0–0.5)
Eosinophils Relative: 1 %
HCT: 34.1 % — ABNORMAL LOW (ref 36.0–46.0)
Hemoglobin: 10.5 g/dL — ABNORMAL LOW (ref 12.0–15.0)
Immature Granulocytes: 0 %
Lymphocytes Relative: 6 %
Lymphs Abs: 0.5 10*3/uL — ABNORMAL LOW (ref 0.7–4.0)
MCH: 26.7 pg (ref 26.0–34.0)
MCHC: 30.8 g/dL (ref 30.0–36.0)
MCV: 86.8 fL (ref 80.0–100.0)
Monocytes Absolute: 0.6 10*3/uL (ref 0.1–1.0)
Monocytes Relative: 8 %
Neutro Abs: 6.2 10*3/uL (ref 1.7–7.7)
Neutrophils Relative %: 85 %
Platelets: 159 10*3/uL (ref 150–400)
RBC: 3.93 MIL/uL (ref 3.87–5.11)
RDW: 14.5 % (ref 11.5–15.5)
WBC: 7.4 10*3/uL (ref 4.0–10.5)
nRBC: 0 % (ref 0.0–0.2)

## 2019-03-06 LAB — TROPONIN I (HIGH SENSITIVITY)
Troponin I (High Sensitivity): 44 ng/L — ABNORMAL HIGH (ref <18)
Troponin I (High Sensitivity): 46 ng/L — ABNORMAL HIGH (ref <18)

## 2019-03-06 LAB — LIPASE, BLOOD: Lipase: 1047 U/L — ABNORMAL HIGH (ref 11–51)

## 2019-03-06 LAB — URINALYSIS, ROUTINE W REFLEX MICROSCOPIC
Bacteria, UA: NONE SEEN
Bilirubin Urine: NEGATIVE
Glucose, UA: NEGATIVE mg/dL
Ketones, ur: NEGATIVE mg/dL
Nitrite: NEGATIVE
Protein, ur: 100 mg/dL — AB
RBC / HPF: >50 RBC/hpf — ABNORMAL HIGH (ref 0–5)
Specific Gravity, Urine: 1.016 (ref 1.005–1.030)
pH: 7 (ref 5.0–8.0)

## 2019-03-06 MED ORDER — PANTOPRAZOLE SODIUM 40 MG IV SOLR
40.0000 mg | INTRAVENOUS | Status: DC
  Administered 2019-03-06 – 2019-03-10 (×5): 40 mg via INTRAVENOUS
  Filled 2019-03-06 (×5): qty 40

## 2019-03-06 MED ORDER — ONDANSETRON HCL 4 MG/2ML IJ SOLN
4.0000 mg | Freq: Four times a day (QID) | INTRAMUSCULAR | Status: DC | PRN
  Administered 2019-03-07 – 2019-03-11 (×2): 4 mg via INTRAVENOUS
  Filled 2019-03-06 (×2): qty 2

## 2019-03-06 MED ORDER — ACETAMINOPHEN 325 MG PO TABS
650.0000 mg | ORAL_TABLET | Freq: Four times a day (QID) | ORAL | Status: DC | PRN
  Administered 2019-03-07: 650 mg via ORAL
  Filled 2019-03-06 (×2): qty 2

## 2019-03-06 MED ORDER — ONDANSETRON HCL 4 MG PO TABS: 4.0000 mg | ORAL_TABLET | Freq: Four times a day (QID) | ORAL | Status: DC | PRN

## 2019-03-06 MED ORDER — LEVOTHYROXINE SODIUM 100 MCG/5ML IV SOLN
55.0000 ug | Freq: Every day | INTRAVENOUS | Status: DC
  Administered 2019-03-07 – 2019-03-09 (×3): 55 ug via INTRAVENOUS
  Filled 2019-03-06 (×5): qty 5

## 2019-03-06 MED ORDER — MORPHINE SULFATE (PF) 2 MG/ML IV SOLN
2.0000 mg | Freq: Once | INTRAVENOUS | Status: AC
  Administered 2019-03-06: 2 mg via INTRAVENOUS
  Filled 2019-03-06: qty 1

## 2019-03-06 MED ORDER — ACETAMINOPHEN 650 MG RE SUPP: 650.0000 mg | Freq: Four times a day (QID) | RECTAL | Status: DC | PRN

## 2019-03-06 MED ORDER — LACTATED RINGERS IV SOLN
INTRAVENOUS | Status: DC
  Administered 2019-03-06 – 2019-03-08 (×3): via INTRAVENOUS

## 2019-03-06 MED ORDER — SODIUM CHLORIDE 0.9 % IV SOLN
100.0000 mg | Freq: Two times a day (BID) | INTRAVENOUS | Status: DC
  Administered 2019-03-07 – 2019-03-11 (×10): 100 mg via INTRAVENOUS
  Filled 2019-03-06 (×14): qty 100

## 2019-03-06 MED ORDER — IOHEXOL 300 MG/ML  SOLN
100.0000 mL | Freq: Once | INTRAMUSCULAR | Status: AC | PRN
  Administered 2019-03-06: 100 mL via INTRAVENOUS

## 2019-03-06 NOTE — ED Notes (Signed)
Bladder scan showed 0ml.  

## 2019-03-06 NOTE — H&P (Signed)
TRH H&P    Patient Demographics:    Brandy Fitzpatrick, is a 85 y.o. female  MRN: [id number]  DOB - 07/07/1919  Admit Date - 03/06/2019  Referring MD/NP/PA: Evalee Jefferson  Outpatient Primary MD for the patient is Loman Brooklyn, FNP  Patient coming from: Home  Chief complaint-abdominal pain   HPI:    Brandy Fitzpatrick  is a 85 y.o. female, with medical history of hypothyroidism, vitamin B12 deficiency who was recently discharged from the hospital after she was treated for gross hematuria.  Patient was found to have bladder mass, staph aureus UTI.  Plan was to manage bladder cancer conservatively and follow-up with urology.  Foley catheter was placed. Today patient came back to hospital complaint of squeezing abdominal pain.  She had no nausea or vomiting. She denies chest pain, shortness of breath. Denies fever or chills.  In the ED, lab work revealed lipase elevated 1,047 AST 160, ALT 80, alk phos 206, total bilirubin elevated 2.3.  CT abdomen pelvis showed no changes of acute pancreatitis within the body of pancreas with fluid collection noted along the anterior aspect of pancreas as well as diffuse pancreatic edema.  Stable dilation of gallbladder with gallbladder sludge and gallstones.  Slight increase in the degree of intrahepatic and extrahepatic biliary ductal dilatation.      Review of systems:    In addition to the HPI above,    All other systems reviewed and are negative.    Past History of the following :    Past Medical History:  Diagnosis Date  . Blind right eye 2017   possible stroke in eye; nerves burned so it would no longer cause pain  . Fracture of unspecified part of neck of left femur, initial encounter for closed fracture ([place]) 05/16/2015  . Renal disorder   . Thyroid disease       Past Surgical History:  Procedure Laterality Date  . JOINT REPLACEMENT  2017   hip      Social  History:      Social History   Tobacco Use  . Smoking status: Never Smoker  . Smokeless tobacco: Never Used  Substance Use Topics  . Alcohol use: Never       Family History :     Family History  Problem Relation Age of Onset  . Stroke Mother   . Heart disease Mother   . Stroke Father   . Heart disease Father   . Parkinson's disease Sister   . Dementia Sister   . Stroke Sister   . Heart attack Brother       Home Medications:   Prior to Admission medications   Medication Sig Start Date End Date Taking? Authorizing Provider  aspirin 81 MG chewable tablet Chew 81 mg by mouth daily.    Yes [provider]  digoxin (LANOXIN) 0.125 MG tablet Take 1 tablet (0.125 mg total) by mouth daily. 10/08/18  Yes Hendricks Limes F, FNP  doxycycline (VIBRA-TABS) 100 MG tablet Take 1 tablet (100 mg total) by mouth 2 (two)  times daily for 7 days. 03/04/19 03/11/19 Yes Barton Dubois, MD  levothyroxine (SYNTHROID) 112 MCG tablet Take 1 tablet (112 mcg total) by mouth daily before breakfast. 10/08/18  Yes Loman Brooklyn, FNP  pantoprazole (PROTONIX) 40 MG tablet Take 1 tablet (40 mg total) by mouth daily. 03/05/19  Yes Barton Dubois, MD  Propylene Glycol (SYSTANE BALANCE) 0.6 % SOLN Apply 1-2 drops to eye 4 (four) times daily as needed. 02/09/19  Yes Hendricks Limes F, FNP  triamcinolone cream (KENALOG) 0.1 % Apply 1 application topically 2 (two) times daily. Patient taking differently: Apply 1 application topically 2 (two) times daily as needed (for skin irritation).  02/09/19  Yes Loman Brooklyn, FNP     Allergies:     Allergies  Allergen Reactions  . Codeine Rash  . Propoxyphene Rash  . Sulfa Antibiotics Rash     Physical Exam:   Vitals  Blood pressure 117/71, pulse 86, temperature 99 F (37.2 C), temperature source Oral, resp. rate 18, height 5' (1.524 m), weight 48.2 kg, SpO2 94 %.  1.  General: Appears in no acute distress  2. Psychiatric: Alert, oriented x3,  normal mood and affect  3. Neurologic: Cranial nerves II through XII grossly intact, no focal deficit noted  4. HEENMT:  Atraumatic normocephalic, extraocular muscles are intact  5. Respiratory : Clear to auscultation bilaterally, no wheezing or crackles  6. Cardiovascular : S1-S2, regular, no murmur auscultated  7. Gastrointestinal:  Abdomen is soft, distended, mild generalized tenderness palpation worse in right upper quadrant     Data Review:    CBC Recent Labs  Lab 03/01/19 1453 03/02/19 0449 03/03/19 0523 03/06/19 1649  WBC 5.0 4.1 11.6* 7.4  HGB 10.8* 9.9* 11.4* 10.5*  HCT 36.0 32.8* 37.0 34.1*  PLT 187 163 228 159  MCV 88.9 89.1 87.5 86.8  MCH 26.7 26.9 27.0 26.7  MCHC 30.0 30.2 30.8 30.8  RDW 14.5 14.4 14.4 14.5  LYMPHSABS 1.1  --   --  0.5*  MONOABS 0.5  --   --  0.6  EOSABS 0.1  --   --  0.1  BASOSABS 0.0  --   --  0.0   ------------------------------------------------------------------------------------------------------------------  Results for orders placed or performed during the hospital encounter of 03/06/19 (from the past 48 hour(s))  Urinalysis, Routine w reflex microscopic     Status: Abnormal   Collection Time: 03/06/19  4:19 PM  Result Value Ref Range   Color, Urine AMBER (A) YELLOW    Comment: BIOCHEMICALS MAY BE AFFECTED BY COLOR   APPearance CLOUDY (A) CLEAR   Specific Gravity, Urine 1.016 1.005 - 1.030   pH 7.0 5.0 - 8.0   Glucose, UA NEGATIVE NEGATIVE mg/dL   Hgb urine dipstick LARGE (A) NEGATIVE   Bilirubin Urine NEGATIVE NEGATIVE   Ketones, ur NEGATIVE NEGATIVE mg/dL   Protein, ur 100 (A) NEGATIVE mg/dL   Nitrite NEGATIVE NEGATIVE   Leukocytes,Ua TRACE (A) NEGATIVE   RBC / HPF >50 (H) 0 - 5 RBC/hpf   WBC, UA 11-20 0 - 5 WBC/hpf   Bacteria, UA NONE SEEN NONE SEEN   WBC Clumps PRESENT    Mucus PRESENT     Comment: Performed at [facility], [address], [place], [place] [address]  CBC with Differential     Status: Abnormal     Collection Time: 03/06/19  4:49 PM  Result Value Ref Range   WBC 7.4 4.0 - 10.5 K/uL   RBC 3.93 3.87 - 5.11  MIL/uL   Hemoglobin 10.5 (L) 12.0 - 15.0 g/dL   HCT 34.1 (L) 36.0 - 46.0 %   MCV 86.8 80.0 - 100.0 fL   MCH 26.7 26.0 - 34.0 pg   MCHC 30.8 30.0 - 36.0 g/dL   RDW 14.5 11.5 - 15.5 %   Platelets 159 150 - 400 K/uL   nRBC 0.0 0.0 - 0.2 %   Neutrophils Relative % 85 %   Neutro Abs 6.2 1.7 - 7.7 K/uL   Lymphocytes Relative 6 %   Lymphs Abs 0.5 (L) 0.7 - 4.0 K/uL   Monocytes Relative 8 %   Monocytes Absolute 0.6 0.1 - 1.0 K/uL   Eosinophils Relative 1 %   Eosinophils Absolute 0.1 0.0 - 0.5 K/uL   Basophils Relative 0 %   Basophils Absolute 0.0 0.0 - 0.1 K/uL   Immature Granulocytes 0 %   Abs Immature Granulocytes 0.03 0.00 - 0.07 K/uL    Comment: Performed at [facility], [address], [place], [place] [address]  Comprehensive metabolic panel     Status: Abnormal   Collection Time: 03/06/19  4:49 PM  Result Value Ref Range   Sodium 134 (L) 135 - 145 mmol/L   Potassium 3.5 3.5 - 5.1 mmol/L   Chloride 104 98 - 111 mmol/L   CO2 24 22 - 32 mmol/L   Glucose, Bld 132 (H) 70 - 99 mg/dL   BUN 17 8 - 23 mg/dL   Creatinine, Ser 0.62 0.44 - 1.00 mg/dL   Calcium 9.1 8.9 - 10.3 mg/dL   Total Protein 6.2 (L) 6.5 - 8.1 g/dL   Albumin 2.9 (L) 3.5 - 5.0 g/dL   AST 160 (H) 15 - 41 U/L   ALT 80 (H) 0 - 44 U/L   Alkaline Phosphatase 206 (H) 38 - 126 U/L   Total Bilirubin 2.3 (H) 0.3 - 1.2 mg/dL   GFR calc non Af Amer >60 >60 mL/min   GFR calc Af Amer >60 >60 mL/min   Anion gap 6 5 - 15    Comment: Performed at [facility], [address], [place], [place] [address]  Lipase     Status: Abnormal   Collection Time: 03/06/19  4:49 PM  Result Value Ref Range   Lipase 1,047 (H) 11 - 51 U/L    Comment: RESULTS CONFIRMED BY MANUAL DILUTION Performed at [facility], [address], [place], [place] [address]   Troponin I (High Sensitivity)     Status: Abnormal   Collection Time:  03/06/19  4:49 PM  Result Value Ref Range   Troponin I (High Sensitivity) 44 (H) <18 ng/L    Comment: (NOTE) Elevated high sensitivity troponin I (hsTnI) values and significant  changes across serial measurements may suggest ACS but many other  chronic and acute conditions are known to elevate hsTnI results.  Refer to the "Links" section for chest pain algorithms and additional  guidance. Performed at [facility], [address], [place], [place] [address]   Troponin I (High Sensitivity)     Status: Abnormal   Collection Time: 03/06/19  6:25 PM  Result Value Ref Range   Troponin I (High Sensitivity) 46 (H) <18 ng/L    Comment: (NOTE) Elevated high sensitivity troponin I (hsTnI) values and significant  changes across serial measurements may suggest ACS but many other  chronic and acute conditions are known to elevate hsTnI results.  Refer to the "Links" section for chest pain algorithms and additional  guidance. Performed at [facility], [address], [place], [place] [address]     Chemistries  Recent Labs  Lab 03/01/19 1453 03/02/19 0449 03/06/19 1649  NA 138 142 134*  K 4.3 4.0 3.5  CL 106 109 104  CO2 _0 GLUCOSE 110* 87 132*  BUN _1 CREATININE 0.74 0.56 0.62  CALCIUM 9.8 9.4 9.1  AST 24  --  160*  ALT 15  --  80*  ALKPHOS 96  --  206*  BILITOT 0.7  --  2.3*   ------------------------------------------------------------------------------------------------------------------  ------------------------------------------------------------------------------------------------------------------ GFR: Estimated Creatinine Clearance: 29.5 mL/min (by C-G formula based on SCr of 0.62 mg/dL). Liver Function Tests: Recent Labs  Lab 03/01/19 1453 03/06/19 1649  AST 24 160*  ALT 15 80*  ALKPHOS 96 206*  BILITOT 0.7 2.3*  PROT 6.8 6.2*  ALBUMIN 3.6 2.9*   Recent Labs  Lab 03/06/19 1649  LIPASE 1,047*   CBG: Recent Labs  Lab 03/03/19 1603  03/03/19 2245 03/04/19 0756 03/04/19 1146  GLUCAP 145* 113* 80 89   Lipid Profile: No results for input(s): CHOL, HDL, LDLCALC, TRIG, CHOLHDL, LDLDIRECT in the last 72 hours. Thyroid Function Tests: No results for input(s): TSH, T4TOTAL, FREET4, T3FREE, THYROIDAB in the last 72 hours. Anemia Panel: No results for input(s): VITAMINB12, FOLATE, FERRITIN, TIBC, IRON, RETICCTPCT in the last 72 hours.  --------------------------------------------------------------------------------------------------------------- Urine analysis:    Component Value Date/Time   COLORURINE AMBER (A) 03/06/2019 1619   APPEARANCEUR CLOUDY (A) 03/06/2019 1619   LABSPEC 1.016 03/06/2019 1619   PHURINE 7.0 03/06/2019 1619   GLUCOSEU NEGATIVE 03/06/2019 1619   HGBUR LARGE (A) 03/06/2019 1619   BILIRUBINUR NEGATIVE 03/06/2019 1619   KETONESUR NEGATIVE 03/06/2019 1619   PROTEINUR 100 (A) 03/06/2019 1619   NITRITE NEGATIVE 03/06/2019 1619   LEUKOCYTESUR TRACE (A) 03/06/2019 1619      Imaging Results:    CT Chest W Contrast  Result Date: 03/06/2019 CLINICAL DATA:  Abnormal chest x-ray with findings suspicious for pulmonary nodules EXAM: CT CHEST, ABDOMEN, AND PELVIS WITH CONTRAST TECHNIQUE: Multidetector CT imaging of the chest, abdomen and pelvis was performed following the standard protocol during bolus administration of intravenous contrast. CONTRAST:  132m OMNIPAQUE IOHEXOL 300 MG/ML  SOLN COMPARISON:  Chest x-ray from earlier in the same day, CT from 08/06/2005. FINDINGS: CT CHEST FINDINGS Cardiovascular: Thoracic aorta demonstrates a normal branching pattern. No aneurysmal dilatation or dissection is seen. Atherosclerotic calcifications are noted of a mild degree. Heart is mildly enlarged. Coronary calcifications are seen. The pulmonary artery shows no large central pulmonary embolus. Mediastinum/Nodes: Thoracic inlet is within normal limits. No hilar or mediastinal adenopathy is noted. The esophagus is  within normal limits. Lungs/Pleura: The lungs are well aerated bilaterally. There are patchy areas of atelectasis/early infiltrate with small effusions in the lower lobes bilaterally. Multiple scattered pulmonary nodules are seen in both lungs. The largest of these on the left measures almost 10 mm in greatest dimension. The largest of these on the right measures almost 2 cm with some enhancement identified. These likely represent metastatic disease. No pneumothorax is seen. Musculoskeletal: Degenerative changes of the thoracic spine are noted. Increased kyphosis is seen. No definitive acute rib abnormality is noted. Chronic appearing compression deformities are noted at T4, T5 and T11. CT ABDOMEN PELVIS FINDINGS Hepatobiliary: Liver is well visualized without focal mass. Slight increase in intrahepatic biliary ductal dilatation is noted. The gallbladder is well distended with evidence of gallstones and gallbladder sludge stable from the prior exam. No  pericholecystic fluid is noted. Common bile duct is dilated and slightly increased when compared with the prior exam likely related to the underlying pancreatitis. Pancreas: Pancreas demonstrates diffuse mottled enhancement with evidence of a fluid collection measuring 5.6 x 2.1 cm in greatest transverse and AP dimensions respectively along the anterior aspect of the pancreatic body. This was not present on the prior exam and is consistent with acute pancreatitis. Diffuse edema throughout the majority of the pancreas body is noted consistent with the pancreatitis. This corresponds with the significantly elevated lipase on laboratory values. Spleen: Normal in size without focal abnormality. Adrenals/Urinary Tract: Adrenal glands are within normal limits. Kidneys demonstrate a normal enhancement pattern bilaterally. Normal excretion of contrast is seen. The bladder is decompressed by Foley catheter. Previously seen left lateral bladder mass is noted but incompletely  evaluated due to the decompression of the bladder. Stomach/Bowel: No obstructive or inflammatory changes of the colon are noted. The appendix is well visualized best seen on the coronal imaging and within normal limits. Small bowel is within normal limits. No gastric abnormality is seen. Vascular/Lymphatic: Aortic atherosclerosis. No enlarged abdominal or pelvic lymph nodes. Reproductive: Uterus and bilateral adnexa are unremarkable. Other: No significant free pelvic fluid is noted. Mild perihepatic fluid is noted likely related to the underlying pancreatitis. Musculoskeletal: L1 and L3 compression deformities are again seen. Degenerative changes are noted. IMPRESSION: CT of the chest: New bibasilar atelectatic changes with small effusions. Multiple bilateral pulmonary nodules the largest of which lies in the right lower lobe posteriorly as described. Given the patient's known bladder mass disease are likely metastatic in nature. Stable compression deformities are noted. CT of the abdomen and pelvis: New changes of acute pancreatitis within the body of the pancreas with a fluid collection noted along the anterior aspect of the pancreas as well as diffuse pancreatic edema. Some mild free fluid is noted related to these changes as well. Stable dilatation of the gallbladder with gallbladder sludge and gallstones. Slight increase in the degree of intrahepatic and extrahepatic biliary ductal dilatation. This is likely related to the underlying pancreatitis. Bladder is decompressed although the known left bladder mass is again noted. Electronically Signed   By: Inez Catalina M.D.   On: 03/06/2019 21:06   CT ABDOMEN PELVIS W CONTRAST  Result Date: 03/06/2019 CLINICAL DATA:  Abnormal chest x-ray with findings suspicious for pulmonary nodules EXAM: CT CHEST, ABDOMEN, AND PELVIS WITH CONTRAST TECHNIQUE: Multidetector CT imaging of the chest, abdomen and pelvis was performed following the standard protocol during bolus  administration of intravenous contrast. CONTRAST:  166m OMNIPAQUE IOHEXOL 300 MG/ML  SOLN COMPARISON:  Chest x-ray from earlier in the same day, CT from 08/06/2005. FINDINGS: CT CHEST FINDINGS Cardiovascular: Thoracic aorta demonstrates a normal branching pattern. No aneurysmal dilatation or dissection is seen. Atherosclerotic calcifications are noted of a mild degree. Heart is mildly enlarged. Coronary calcifications are seen. The pulmonary artery shows no large central pulmonary embolus. Mediastinum/Nodes: Thoracic inlet is within normal limits. No hilar or mediastinal adenopathy is noted. The esophagus is within normal limits. Lungs/Pleura: The lungs are well aerated bilaterally. There are patchy areas of atelectasis/early infiltrate with small effusions in the lower lobes bilaterally. Multiple scattered pulmonary nodules are seen in both lungs. The largest of these on the left measures almost 10 mm in greatest dimension. The largest of these on the right measures almost 2 cm with some enhancement identified. These likely represent metastatic disease. No pneumothorax is seen. Musculoskeletal: Degenerative changes of the thoracic spine  are noted. Increased kyphosis is seen. No definitive acute rib abnormality is noted. Chronic appearing compression deformities are noted at T4, T5 and T11. CT ABDOMEN PELVIS FINDINGS Hepatobiliary: Liver is well visualized without focal mass. Slight increase in intrahepatic biliary ductal dilatation is noted. The gallbladder is well distended with evidence of gallstones and gallbladder sludge stable from the prior exam. No pericholecystic fluid is noted. Common bile duct is dilated and slightly increased when compared with the prior exam likely related to the underlying pancreatitis. Pancreas: Pancreas demonstrates diffuse mottled enhancement with evidence of a fluid collection measuring 5.6 x 2.1 cm in greatest transverse and AP dimensions respectively along the anterior aspect of  the pancreatic body. This was not present on the prior exam and is consistent with acute pancreatitis. Diffuse edema throughout the majority of the pancreas body is noted consistent with the pancreatitis. This corresponds with the significantly elevated lipase on laboratory values. Spleen: Normal in size without focal abnormality. Adrenals/Urinary Tract: Adrenal glands are within normal limits. Kidneys demonstrate a normal enhancement pattern bilaterally. Normal excretion of contrast is seen. The bladder is decompressed by Foley catheter. Previously seen left lateral bladder mass is noted but incompletely evaluated due to the decompression of the bladder. Stomach/Bowel: No obstructive or inflammatory changes of the colon are noted. The appendix is well visualized best seen on the coronal imaging and within normal limits. Small bowel is within normal limits. No gastric abnormality is seen. Vascular/Lymphatic: Aortic atherosclerosis. No enlarged abdominal or pelvic lymph nodes. Reproductive: Uterus and bilateral adnexa are unremarkable. Other: No significant free pelvic fluid is noted. Mild perihepatic fluid is noted likely related to the underlying pancreatitis. Musculoskeletal: L1 and L3 compression deformities are again seen. Degenerative changes are noted. IMPRESSION: CT of the chest: New bibasilar atelectatic changes with small effusions. Multiple bilateral pulmonary nodules the largest of which lies in the right lower lobe posteriorly as described. Given the patient's known bladder mass disease are likely metastatic in nature. Stable compression deformities are noted. CT of the abdomen and pelvis: New changes of acute pancreatitis within the body of the pancreas with a fluid collection noted along the anterior aspect of the pancreas as well as diffuse pancreatic edema. Some mild free fluid is noted related to these changes as well. Stable dilatation of the gallbladder with gallbladder sludge and gallstones.  Slight increase in the degree of intrahepatic and extrahepatic biliary ductal dilatation. This is likely related to the underlying pancreatitis. Bladder is decompressed although the known left bladder mass is again noted. Electronically Signed   By: Inez Catalina M.D.   On: 03/06/2019 21:06   Acute Abd Series  Result Date: 03/06/2019 CLINICAL DATA:  Hematuria, abdominal pain EXAM: DG ABDOMEN ACUTE W/ 1V CHEST COMPARISON:  CT 03/01/2019 FINDINGS: There is no evidence of dilated bowel loops or free intraperitoneal air. No radiopaque calculi or other significant radiographic abnormality is seen. Lung volumes are low. Patchy bibasilar opacities may reflect atelectasis. There are more nodular densities within the right mid lung and left upper lobe, new from prior which could represent foci of infection. Metastatic disease not excluded given the findings on recent CT. IMPRESSION: 1. No evidence of bowel obstruction or free intraperitoneal air. 2. New nodular densities within the right mid lung and left upper lobe. Findings could represent infectious or inflammatory nodules. Metastatic disease not excluded given the findings on recent CT. 3. Patchy bibasilar opacities may reflect atelectasis. Electronically Signed   By: Davina Poke M.D.   On:  03/06/2019 17:52    My personal review of EKG: Rhythm NSR, nonspecific ST changes   Assessment & Plan:    Active Problems:   Pancreatitis   1. Acute pancreatitis-likely gallstone induced.  No gallstones seen on CT scan of the abdomen pelvis, she does have elevated LFTs, elevated bilirubin.  Will obtain abdominal ultrasound in a.m..  CT also shows both intra and extrahepatic ductal dilatation.  If abdominal ultrasound is unremarkable consider MRCP.  Will consult GI in a.m.  Patient will be kept n.p.o., start IV fluids LR at 125 mL/h.  Will be little more conservative with IV fluid hydration considering patient's advanced age.  Follow lipase in a.m.  2. Staph  aureus UTI-patient was started on doxycycline at the time of discharge.  Will start doxycycline 100 mg IV every 12 hours.  3. Hypothyroidism-patient takes 112 mcg Synthroid at home.  Will start 55 mcg IV Synthroid daily.  4. Recent diagnosis of bladder cancer-conservative management as per note from Dr. Dyann Kief from 03/04/2019.  Patient to follow-up with urology as outpatient.  Continue Foley catheter drainage.  5. Recent right tibial spine avulsion fracture-right x-ray knee showed lateral tibial spine avulsion fracture, nonoperative management, weightbearing as tolerated.  6. Digoxin use-patient is on digoxin with no clear reason for being on digoxin.  Will hold digoxin at this time.   DVT Prophylaxis-   SCDs  AM Labs Ordered, also please review Full Orders  Family Communication: Admission, patients condition and plan of care including tests being ordered have been discussed with the patient who indicate understanding and agree with the plan and Code Status.  Code Status: DNR  Admission status: /Inpatient: Based on patients clinical presentation and evaluation of above clinical data, I have made determination that patient meets Inpatient criteria at this time.  Time spent in minutes : 60 minutes   Leor Whyte S Marjie Chea M.D

## 2019-03-06 NOTE — ED Notes (Signed)
Pt was just discharged from the hospital  She has a palpable mass to her R lower abd area   States her daughter stayed with her last night   [place] during the day today  Here for further eval   Pt is blind and extremely hard of hearing

## 2019-03-06 NOTE — ED Triage Notes (Signed)
released from [facility] yesterday   CT scan to tumor on RLQ  Here for UTI eval

## 2019-03-06 NOTE — ED Provider Notes (Signed)
[facility] EMERGENCY DEPARTMENT Provider Note   CSN: [id number] Arrival date & time: 03/06/19  1609     History Chief Complaint  Patient presents with  . Abdominal Pain    Brandy Fitzpatrick is a 85 y.o. female with a history as outlined below, discharged from here 2 days ago for treatment of uti and bladder masses, suspicious for possible bladder cancer (scheduled to f/u with Dr. Gloriann Loan in 4 days) presenting for evaluation of abdominal pain. She left this inpatient admission with a Foley catheter in place. She describes a squeezing, constant mid to upper abdomen.  She has had no n/v, or loss of appetite and her last bm was this am and normal, which did not improve her abd. Sx. She denies chest pain, sob, fevers or chills. She has had no tx prior to arrival. Noted that there is no urine in the foley bag or tubing.  She cannot recall when it was last emptied.   HPI     Past Medical History:  Diagnosis Date  . Blind right eye 2017   possible stroke in eye; nerves burned so it would no longer cause pain  . Fracture of unspecified part of neck of left femur, initial encounter for closed fracture ([place]) 05/16/2015  . Renal disorder   . Thyroid disease     Patient Active Problem List   Diagnosis Date Noted  . Pancreatitis 03/06/2019  . Bladder mass   . Acute blood loss anemia 03/02/2019  . Hematuria 03/01/2019  . Non-rheumatic mitral regurgitation 05/21/2015  . Vitamin B 12 deficiency 05/18/2015  . Acquired hypothyroidism 05/16/2015    Past Surgical History:  Procedure Laterality Date  . JOINT REPLACEMENT  2017   hip     OB History   No obstetric history on file.     Family History  Problem Relation Age of Onset  . Stroke Mother   . Heart disease Mother   . Stroke Father   . Heart disease Father   . Parkinson's disease Sister   . Dementia Sister   . Stroke Sister   . Heart attack Brother     Social History   Tobacco Use  . Smoking status: Never Smoker  .  Smokeless tobacco: Never Used  Substance Use Topics  . Alcohol use: Never  . Drug use: Never    Home Medications Prior to Admission medications   Medication Sig Start Date End Date Taking? Authorizing Provider  aspirin 81 MG chewable tablet Chew 81 mg by mouth daily.    Yes [provider]  digoxin (LANOXIN) 0.125 MG tablet Take 1 tablet (0.125 mg total) by mouth daily. 10/08/18  Yes Hendricks Limes F, FNP  doxycycline (VIBRA-TABS) 100 MG tablet Take 1 tablet (100 mg total) by mouth 2 (two) times daily for 7 days. 03/04/19 03/11/19 Yes Barton Dubois, MD  levothyroxine (SYNTHROID) 112 MCG tablet Take 1 tablet (112 mcg total) by mouth daily before breakfast. 10/08/18  Yes Loman Brooklyn, FNP  pantoprazole (PROTONIX) 40 MG tablet Take 1 tablet (40 mg total) by mouth daily. 03/05/19  Yes Barton Dubois, MD  Propylene Glycol (SYSTANE BALANCE) 0.6 % SOLN Apply 1-2 drops to eye 4 (four) times daily as needed. 02/09/19  Yes Hendricks Limes F, FNP  triamcinolone cream (KENALOG) 0.1 % Apply 1 application topically 2 (two) times daily. Patient taking differently: Apply 1 application topically 2 (two) times daily as needed (for skin irritation).  02/09/19  Yes Loman Brooklyn, FNP  Allergies    Codeine, Propoxyphene, and Sulfa antibiotics  Review of Systems   Review of Systems  Constitutional: Negative for chills and fever.  HENT: Negative.   Eyes: Negative.   Respiratory: Negative for chest tightness and shortness of breath.   Cardiovascular: Negative for chest pain.  Gastrointestinal: Positive for abdominal pain. Negative for constipation, diarrhea, nausea and vomiting.  Genitourinary: Negative.   Musculoskeletal: Negative.   Skin: Negative.  Negative for rash and wound.  Neurological: Positive for weakness. Negative for dizziness, light-headedness, numbness and headaches.  Psychiatric/Behavioral: Negative.     Physical Exam Updated Vital Signs BP 117/71   Pulse 86   Temp 99  F (37.2 C) (Oral)   Resp 18   Ht 5' (1.524 m)   Wt 48.2 kg   SpO2 94%   BMI 20.75 kg/m   Physical Exam Vitals and nursing note reviewed.  Constitutional:      Appearance: She is well-developed.  HENT:     Head: Normocephalic and atraumatic.     Comments: Hard of hearing Eyes:     Conjunctiva/sclera: Conjunctivae normal.  Cardiovascular:     Rate and Rhythm: Normal rate and regular rhythm.     Heart sounds: Normal heart sounds.  Pulmonary:     Effort: Pulmonary effort is normal.     Breath sounds: Normal breath sounds. No wheezing.  Abdominal:     General: Abdomen is protuberant. Bowel sounds are increased.     Palpations: Abdomen is soft.     Tenderness: There is generalized abdominal tenderness. There is no guarding or rebound.     Comments: Generalized abd pain, distended but soft.  No guarding.   Musculoskeletal:        General: Normal range of motion.     Cervical back: Normal range of motion.  Skin:    General: Skin is warm and dry.  Neurological:     Mental Status: She is alert.     ED Results / Procedures / Treatments   Labs (all labs ordered are listed, but only abnormal results are displayed) Labs Reviewed  CBC WITH DIFFERENTIAL/PLATELET - Abnormal; Notable for the following components:      Result Value   Hemoglobin 10.5 (*)    HCT 34.1 (*)    Lymphs Abs 0.5 (*)    All other components within normal limits  COMPREHENSIVE METABOLIC PANEL - Abnormal; Notable for the following components:   Sodium 134 (*)    Glucose, Bld 132 (*)    Total Protein 6.2 (*)    Albumin 2.9 (*)    AST 160 (*)    ALT 80 (*)    Alkaline Phosphatase 206 (*)    Total Bilirubin 2.3 (*)    All other components within normal limits  LIPASE, BLOOD - Abnormal; Notable for the following components:   Lipase 1,047 (*)    All other components within normal limits  URINALYSIS, ROUTINE W REFLEX MICROSCOPIC - Abnormal; Notable for the following components:   Color, Urine AMBER (*)      APPearance CLOUDY (*)    Hgb urine dipstick LARGE (*)    Protein, ur 100 (*)    Leukocytes,Ua TRACE (*)    RBC / HPF >50 (*)    All other components within normal limits  TROPONIN I (HIGH SENSITIVITY) - Abnormal; Notable for the following components:   Troponin I (High Sensitivity) 44 (*)    All other components within normal limits  TROPONIN I (HIGH SENSITIVITY) - Abnormal;  Notable for the following components:   Troponin I (High Sensitivity) 46 (*)    All other components within normal limits  SARS CORONAVIRUS 2 (TAT 6-24 HRS)    EKG EKG Interpretation  Date/Time:  Saturday March 06 2019 18:42:58 EST Ventricular Rate:  81 PR Interval:    QRS Duration: 120 QT Interval:  371 QTC Calculation: 431 R Axis:   -46 Text Interpretation: Sinus rhythm Ventricular premature complex Prolonged PR interval LAE, consider biatrial enlargement Nonspecific IVCD with LAD Borderline T abnormalities, lateral leads No STEMI Confirmed by Nanda Quinton [phone]) on 03/06/2019 6:54:44 PM   Radiology CT Chest W Contrast  Result Date: 03/06/2019 CLINICAL DATA:  Abnormal chest x-ray with findings suspicious for pulmonary nodules EXAM: CT CHEST, ABDOMEN, AND PELVIS WITH CONTRAST TECHNIQUE: Multidetector CT imaging of the chest, abdomen and pelvis was performed following the standard protocol during bolus administration of intravenous contrast. CONTRAST:  1103mL OMNIPAQUE IOHEXOL 300 MG/ML  SOLN COMPARISON:  Chest x-ray from earlier in the same day, CT from 08/06/2005. FINDINGS: CT CHEST FINDINGS Cardiovascular: Thoracic aorta demonstrates a normal branching pattern. No aneurysmal dilatation or dissection is seen. Atherosclerotic calcifications are noted of a mild degree. Heart is mildly enlarged. Coronary calcifications are seen. The pulmonary artery shows no large central pulmonary embolus. Mediastinum/Nodes: Thoracic inlet is within normal limits. No hilar or mediastinal adenopathy is noted. The esophagus is  within normal limits. Lungs/Pleura: The lungs are well aerated bilaterally. There are patchy areas of atelectasis/early infiltrate with small effusions in the lower lobes bilaterally. Multiple scattered pulmonary nodules are seen in both lungs. The largest of these on the left measures almost 10 mm in greatest dimension. The largest of these on the right measures almost 2 cm with some enhancement identified. These likely represent metastatic disease. No pneumothorax is seen. Musculoskeletal: Degenerative changes of the thoracic spine are noted. Increased kyphosis is seen. No definitive acute rib abnormality is noted. Chronic appearing compression deformities are noted at T4, T5 and T11. CT ABDOMEN PELVIS FINDINGS Hepatobiliary: Liver is well visualized without focal mass. Slight increase in intrahepatic biliary ductal dilatation is noted. The gallbladder is well distended with evidence of gallstones and gallbladder sludge stable from the prior exam. No pericholecystic fluid is noted. Common bile duct is dilated and slightly increased when compared with the prior exam likely related to the underlying pancreatitis. Pancreas: Pancreas demonstrates diffuse mottled enhancement with evidence of a fluid collection measuring 5.6 x 2.1 cm in greatest transverse and AP dimensions respectively along the anterior aspect of the pancreatic body. This was not present on the prior exam and is consistent with acute pancreatitis. Diffuse edema throughout the majority of the pancreas body is noted consistent with the pancreatitis. This corresponds with the significantly elevated lipase on laboratory values. Spleen: Normal in size without focal abnormality. Adrenals/Urinary Tract: Adrenal glands are within normal limits. Kidneys demonstrate a normal enhancement pattern bilaterally. Normal excretion of contrast is seen. The bladder is decompressed by Foley catheter. Previously seen left lateral bladder mass is noted but incompletely  evaluated due to the decompression of the bladder. Stomach/Bowel: No obstructive or inflammatory changes of the colon are noted. The appendix is well visualized best seen on the coronal imaging and within normal limits. Small bowel is within normal limits. No gastric abnormality is seen. Vascular/Lymphatic: Aortic atherosclerosis. No enlarged abdominal or pelvic lymph nodes. Reproductive: Uterus and bilateral adnexa are unremarkable. Other: No significant free pelvic fluid is noted. Mild perihepatic fluid is noted likely related to  the underlying pancreatitis. Musculoskeletal: L1 and L3 compression deformities are again seen. Degenerative changes are noted. IMPRESSION: CT of the chest: New bibasilar atelectatic changes with small effusions. Multiple bilateral pulmonary nodules the largest of which lies in the right lower lobe posteriorly as described. Given the patient's known bladder mass disease are likely metastatic in nature. Stable compression deformities are noted. CT of the abdomen and pelvis: New changes of acute pancreatitis within the body of the pancreas with a fluid collection noted along the anterior aspect of the pancreas as well as diffuse pancreatic edema. Some mild free fluid is noted related to these changes as well. Stable dilatation of the gallbladder with gallbladder sludge and gallstones. Slight increase in the degree of intrahepatic and extrahepatic biliary ductal dilatation. This is likely related to the underlying pancreatitis. Bladder is decompressed although the known left bladder mass is again noted. Electronically Signed   By: Inez Catalina M.D.   On: 03/06/2019 21:06   CT ABDOMEN PELVIS W CONTRAST  Result Date: 03/06/2019 CLINICAL DATA:  Abnormal chest x-ray with findings suspicious for pulmonary nodules EXAM: CT CHEST, ABDOMEN, AND PELVIS WITH CONTRAST TECHNIQUE: Multidetector CT imaging of the chest, abdomen and pelvis was performed following the standard protocol during bolus  administration of intravenous contrast. CONTRAST:  18mL OMNIPAQUE IOHEXOL 300 MG/ML  SOLN COMPARISON:  Chest x-ray from earlier in the same day, CT from 08/06/2005. FINDINGS: CT CHEST FINDINGS Cardiovascular: Thoracic aorta demonstrates a normal branching pattern. No aneurysmal dilatation or dissection is seen. Atherosclerotic calcifications are noted of a mild degree. Heart is mildly enlarged. Coronary calcifications are seen. The pulmonary artery shows no large central pulmonary embolus. Mediastinum/Nodes: Thoracic inlet is within normal limits. No hilar or mediastinal adenopathy is noted. The esophagus is within normal limits. Lungs/Pleura: The lungs are well aerated bilaterally. There are patchy areas of atelectasis/early infiltrate with small effusions in the lower lobes bilaterally. Multiple scattered pulmonary nodules are seen in both lungs. The largest of these on the left measures almost 10 mm in greatest dimension. The largest of these on the right measures almost 2 cm with some enhancement identified. These likely represent metastatic disease. No pneumothorax is seen. Musculoskeletal: Degenerative changes of the thoracic spine are noted. Increased kyphosis is seen. No definitive acute rib abnormality is noted. Chronic appearing compression deformities are noted at T4, T5 and T11. CT ABDOMEN PELVIS FINDINGS Hepatobiliary: Liver is well visualized without focal mass. Slight increase in intrahepatic biliary ductal dilatation is noted. The gallbladder is well distended with evidence of gallstones and gallbladder sludge stable from the prior exam. No pericholecystic fluid is noted. Common bile duct is dilated and slightly increased when compared with the prior exam likely related to the underlying pancreatitis. Pancreas: Pancreas demonstrates diffuse mottled enhancement with evidence of a fluid collection measuring 5.6 x 2.1 cm in greatest transverse and AP dimensions respectively along the anterior aspect of  the pancreatic body. This was not present on the prior exam and is consistent with acute pancreatitis. Diffuse edema throughout the majority of the pancreas body is noted consistent with the pancreatitis. This corresponds with the significantly elevated lipase on laboratory values. Spleen: Normal in size without focal abnormality. Adrenals/Urinary Tract: Adrenal glands are within normal limits. Kidneys demonstrate a normal enhancement pattern bilaterally. Normal excretion of contrast is seen. The bladder is decompressed by Foley catheter. Previously seen left lateral bladder mass is noted but incompletely evaluated due to the decompression of the bladder. Stomach/Bowel: No obstructive or inflammatory changes of  the colon are noted. The appendix is well visualized best seen on the coronal imaging and within normal limits. Small bowel is within normal limits. No gastric abnormality is seen. Vascular/Lymphatic: Aortic atherosclerosis. No enlarged abdominal or pelvic lymph nodes. Reproductive: Uterus and bilateral adnexa are unremarkable. Other: No significant free pelvic fluid is noted. Mild perihepatic fluid is noted likely related to the underlying pancreatitis. Musculoskeletal: L1 and L3 compression deformities are again seen. Degenerative changes are noted. IMPRESSION: CT of the chest: New bibasilar atelectatic changes with small effusions. Multiple bilateral pulmonary nodules the largest of which lies in the right lower lobe posteriorly as described. Given the patient's known bladder mass disease are likely metastatic in nature. Stable compression deformities are noted. CT of the abdomen and pelvis: New changes of acute pancreatitis within the body of the pancreas with a fluid collection noted along the anterior aspect of the pancreas as well as diffuse pancreatic edema. Some mild free fluid is noted related to these changes as well. Stable dilatation of the gallbladder with gallbladder sludge and gallstones.  Slight increase in the degree of intrahepatic and extrahepatic biliary ductal dilatation. This is likely related to the underlying pancreatitis. Bladder is decompressed although the known left bladder mass is again noted. Electronically Signed   By: Inez Catalina M.D.   On: 03/06/2019 21:06   Acute Abd Series  Result Date: 03/06/2019 CLINICAL DATA:  Hematuria, abdominal pain EXAM: DG ABDOMEN ACUTE W/ 1V CHEST COMPARISON:  CT 03/01/2019 FINDINGS: There is no evidence of dilated bowel loops or free intraperitoneal air. No radiopaque calculi or other significant radiographic abnormality is seen. Lung volumes are low. Patchy bibasilar opacities may reflect atelectasis. There are more nodular densities within the right mid lung and left upper lobe, new from prior which could represent foci of infection. Metastatic disease not excluded given the findings on recent CT. IMPRESSION: 1. No evidence of bowel obstruction or free intraperitoneal air. 2. New nodular densities within the right mid lung and left upper lobe. Findings could represent infectious or inflammatory nodules. Metastatic disease not excluded given the findings on recent CT. 3. Patchy bibasilar opacities may reflect atelectasis. Electronically Signed   By: Davina Poke M.D.   On: 03/06/2019 17:52    Procedures Procedures (including critical care time)  Medications Ordered in ED Medications  morphine 2 MG/ML injection 2 mg (2 mg Intravenous Given 03/06/19 1852)  iohexol (OMNIPAQUE) 300 MG/ML solution 100 mL (100 mLs Intravenous Contrast Given 03/06/19 2017)    ED Course  I have reviewed the triage vital signs and the nursing notes.  Pertinent labs & imaging results that were available during my care of the patient were reviewed by me and considered in my medical decision making (see chart for details).    MDM Rules/Calculators/A&P                      10:51 PM Bladder scan with no sig urinary retention, less than 30 cc urine  detected.  Labs and imaging reviewed.  Pt with acute pancreatitis and elevation of LFT's with known gallstone per Ct imaging during recent admission.  CT today with acute pancreatitis confirmed, no acute cholecystitis but with suggestion of borderline dilation of cbd. Discussed with Dr. Darrick Meigs who agrees with admission.  Also call placed to Dr. Gala Romney, pending consult with GI.    Final Clinical Impression(s) / ED Diagnoses Final diagnoses:  Acute pancreatitis, unspecified complication status, unspecified pancreatitis type  Elevation of levels of  liver transaminase levels  Gallstones    Rx / DC Orders ED Discharge Orders    None       Landis Martins 03/06/19 2251    Margette Fast, MD 03/07/19 [phone]

## 2019-03-07 ENCOUNTER — Inpatient Hospital Stay (HOSPITAL_COMMUNITY): Payer: Medicare Other

## 2019-03-07 ENCOUNTER — Other Ambulatory Visit: Payer: Self-pay

## 2019-03-07 LAB — COMPREHENSIVE METABOLIC PANEL
ALT: 87 U/L — ABNORMAL HIGH (ref 0–44)
AST: 152 U/L — ABNORMAL HIGH (ref 15–41)
Albumin: 2.3 g/dL — ABNORMAL LOW (ref 3.5–5.0)
Alkaline Phosphatase: 182 U/L — ABNORMAL HIGH (ref 38–126)
Anion gap: 4 — ABNORMAL LOW (ref 5–15)
BUN: 14 mg/dL (ref 8–23)
CO2: 24 mmol/L (ref 22–32)
Calcium: 9 mg/dL (ref 8.9–10.3)
Chloride: 108 mmol/L (ref 98–111)
Creatinine, Ser: 0.51 mg/dL (ref 0.44–1.00)
GFR calc Af Amer: >60 mL/min (ref >60)
GFR calc non Af Amer: >60 mL/min (ref >60)
Glucose, Bld: 108 mg/dL — ABNORMAL HIGH (ref 70–99)
Potassium: 3.5 mmol/L (ref 3.5–5.1)
Sodium: 136 mmol/L (ref 135–145)
Total Bilirubin: 3.1 mg/dL — ABNORMAL HIGH (ref 0.3–1.2)
Total Protein: 5.2 g/dL — ABNORMAL LOW (ref 6.5–8.1)

## 2019-03-07 LAB — CBC
HCT: 31.1 % — ABNORMAL LOW (ref 36.0–46.0)
Hemoglobin: 9.6 g/dL — ABNORMAL LOW (ref 12.0–15.0)
MCH: 26.8 pg (ref 26.0–34.0)
MCHC: 30.9 g/dL (ref 30.0–36.0)
MCV: 86.9 fL (ref 80.0–100.0)
Platelets: 140 10*3/uL — ABNORMAL LOW (ref 150–400)
RBC: 3.58 MIL/uL — ABNORMAL LOW (ref 3.87–5.11)
RDW: 14.6 % (ref 11.5–15.5)
WBC: 7.5 10*3/uL (ref 4.0–10.5)
nRBC: 0 % (ref 0.0–0.2)

## 2019-03-07 LAB — LIPASE, BLOOD: Lipase: 316 U/L — ABNORMAL HIGH (ref 11–51)

## 2019-03-07 LAB — SARS CORONAVIRUS 2 (TAT 6-24 HRS): SARS Coronavirus 2: NEGATIVE

## 2019-03-07 NOTE — Consult Note (Signed)
Referring Provider: No ref. provider found Primary Care Physician:  Loman Brooklyn, FNP Primary Gastroenterologist:  Dr. Gala Romney  Reason for Consultation: Pancreatitis.  HPI: Pleasant 85 year old lady just discharged from [facility] after admission for evaluation of gross hematuria.  Findings of that work-up suspicious for a bladder cancer.  She has a Staphylococcus urinary tract infection and is being treated with doxycycline.  Patient went home and developed acute onset epigastric/periumbilical pain.  Returned to the emergency department.  She was found to have pancreatitis based on elevated lipase of 1047. CT demonstrated cholelithiasis, distended gallbladder, intra and extrahepatic biliary dilation. Acute interstitial pancreatitis with a small amount of peripancreatic fluid noted.  Also, multiple basilar pulmonary nodules suspicious for metastatic disease. AST/ALT/alkaline phosphatase and bilirubin are mildly elevated-see below. Patient denies prior episodes of pancreatitis.  There is no alcohol exposure. No prior GI illness.  Prior EGD and/or colonoscopy unknown.  Of note, her troponins have been elevated on admission.  She received morphine IV for pain control.  When I came to see her this morning in the ED room 12, she said she was 100% better.  In fact, she had already been advanced to a heart healthy diet. Had cereal for breakfast.  Having a bedside ultrasound.  Patient denies any chronic symptoms of indigestion, dysphagia, abdominal pain, nausea, vomiting, melena or hematochezia.  She denies constipation or diarrhea.  States her appetite has been good.  Past Medical History:  Diagnosis Date  . Blind right eye 2017   possible stroke in eye; nerves burned so it would no longer cause pain  . Fracture of unspecified part of neck of left femur, initial encounter for closed fracture ([place]) 05/16/2015  . Renal disorder   . Thyroid disease     Past Surgical History:  Procedure  Laterality Date  . JOINT REPLACEMENT  2017   hip    Prior to Admission medications   Medication Sig Start Date End Date Taking? Authorizing Provider  aspirin 81 MG chewable tablet Chew 81 mg by mouth daily.    Yes [provider]  digoxin (LANOXIN) 0.125 MG tablet Take 1 tablet (0.125 mg total) by mouth daily. 10/08/18  Yes Hendricks Limes F, FNP  doxycycline (VIBRA-TABS) 100 MG tablet Take 1 tablet (100 mg total) by mouth 2 (two) times daily for 7 days. 03/04/19 03/11/19 Yes Barton Dubois, MD  levothyroxine (SYNTHROID) 112 MCG tablet Take 1 tablet (112 mcg total) by mouth daily before breakfast. 10/08/18  Yes Loman Brooklyn, FNP  pantoprazole (PROTONIX) 40 MG tablet Take 1 tablet (40 mg total) by mouth daily. 03/05/19  Yes Barton Dubois, MD  Propylene Glycol (SYSTANE BALANCE) 0.6 % SOLN Apply 1-2 drops to eye 4 (four) times daily as needed. 02/09/19  Yes Hendricks Limes F, FNP  triamcinolone cream (KENALOG) 0.1 % Apply 1 application topically 2 (two) times daily. Patient taking differently: Apply 1 application topically 2 (two) times daily as needed (for skin irritation).  02/09/19  Yes Loman Brooklyn, FNP    Current Facility-Administered Medications  Medication Dose Route Frequency Provider Last Rate Last Admin  . acetaminophen (TYLENOL) tablet 650 mg  650 mg Oral Q6H PRN Oswald Hillock, MD       Or  . acetaminophen (TYLENOL) suppository 650 mg  650 mg Rectal Q6H PRN Oswald Hillock, MD      . doxycycline (VIBRAMYCIN) 100 mg in sodium chloride 0.9 % 250 mL IVPB  100 mg Intravenous Q12H [place], Gagan S,  MD 125 mL/hr at 03/07/19 1114 100 mg at 03/07/19 1114  . lactated ringers infusion   Intravenous Continuous Oswald Hillock, MD 125 mL/hr at 03/07/19 0616 Rate Verify at 03/07/19 0616  . levothyroxine (SYNTHROID, LEVOTHROID) injection 55 mcg  55 mcg Intravenous Daily Oswald Hillock, MD   55 mcg at 03/07/19 1111  . ondansetron (ZOFRAN) tablet 4 mg  4 mg Oral Q6H PRN Oswald Hillock, MD         Or  . ondansetron (ZOFRAN) injection 4 mg  4 mg Intravenous Q6H PRN Oswald Hillock, MD      . pantoprazole (PROTONIX) injection 40 mg  40 mg Intravenous Q24H Oswald Hillock, MD   40 mg at 03/06/19 2335   Current Outpatient Medications  Medication Sig Dispense Refill  . aspirin 81 MG chewable tablet Chew 81 mg by mouth daily.     . digoxin (LANOXIN) 0.125 MG tablet Take 1 tablet (0.125 mg total) by mouth daily. 90 tablet 1  . doxycycline (VIBRA-TABS) 100 MG tablet Take 1 tablet (100 mg total) by mouth 2 (two) times daily for 7 days. 14 tablet 0  . levothyroxine (SYNTHROID) 112 MCG tablet Take 1 tablet (112 mcg total) by mouth daily before breakfast. 90 tablet 1  . pantoprazole (PROTONIX) 40 MG tablet Take 1 tablet (40 mg total) by mouth daily. 30 tablet 1  . Propylene Glycol (SYSTANE BALANCE) 0.6 % SOLN Apply 1-2 drops to eye 4 (four) times daily as needed. 15 mL 2  . triamcinolone cream (KENALOG) 0.1 % Apply 1 application topically 2 (two) times daily. (Patient taking differently: Apply 1 application topically 2 (two) times daily as needed (for skin irritation). ) 45 g 1    Allergies as of 03/06/2019 - Review Complete 03/06/2019  Allergen Reaction Noted  . Codeine Rash 09/13/2013  . Propoxyphene Rash 09/13/2013  . Sulfa antibiotics Rash 09/13/2013    Family History  Problem Relation Age of Onset  . Stroke Mother   . Heart disease Mother   . Stroke Father   . Heart disease Father   . Parkinson's disease Sister   . Dementia Sister   . Stroke Sister   . Heart attack Brother     Social History   Socioeconomic History  . Marital status: Widowed    Spouse name: Not on file  . Number of children: Not on file  . Years of education: Not on file  . Highest education level: Not on file  Occupational History  . Not on file  Tobacco Use  . Smoking status: Never Smoker  . Smokeless tobacco: Never Used  Substance and Sexual Activity  . Alcohol use: Never  . Drug use: Never  .  Sexual activity: Not Currently  Other Topics Concern  . Not on file  Social History Narrative  . Not on file   Social Determinants of Health   Financial Resource Strain:   . Difficulty of Paying Living Expenses: Not on file  Food Insecurity:   . Worried About Charity fundraiser in the Last Year: Not on file  . Ran Out of Food in the Last Year: Not on file  Transportation Needs:   . Lack of Transportation (Medical): Not on file  . Lack of Transportation (Non-Medical): Not on file  Physical Activity:   . Days of Exercise per Week: Not on file  . Minutes of Exercise per Session: Not on file  Stress:   . Feeling of Stress :  Not on file  Social Connections:   . Frequency of Communication with Friends and Family: Not on file  . Frequency of Social Gatherings with Friends and Family: Not on file  . Attends Religious Services: Not on file  . Active Member of Clubs or Organizations: Not on file  . Attends Archivist Meetings: Not on file  . Marital Status: Not on file  Intimate Partner Violence:   . Fear of Current or Ex-Partner: Not on file  . Emotionally Abused: Not on file  . Physically Abused: Not on file  . Sexually Abused: Not on file    Review of Systems: As in HPI.   Physical Exam: Vital signs in last 24 hours: Temp:  [99 F (37.2 C)] 99 F (37.2 C) (12/12 1633) Pulse Rate:  [44-101] 65 (12/13 1100) Resp:  [13-40] 14 (12/13 0500) BP: (104-160)/(55-85) 141/66 (12/13 1100) SpO2:  [90 %-99 %] 96 % (12/13 1100) Weight:  [48.2 kg] 48.2 kg (12/12 1615)   General:   Alert,  pleasant and cooperative in NAD.  Conversant Eyes:  Sclera clear, no icterus.   Conjunctiva pink. Abdomen: Nondistended.  Positive bowel sounds.  Abdomen soft and is minimally tender in the epigastrium.  Diastases recti present.    Intake/Output from previous day: 12/12 0701 - 12/13 0700 In: 1055.3 [I.V.:805; IV Piggyback:250.2] Out: -  Intake/Output this shift: No intake/output data  recorded.  Lab Results: Recent Labs    03/06/19 1649 03/07/19 0410  WBC 7.4 7.5  HGB 10.5* 9.6*  HCT 34.1* 31.1*  PLT 159 140*   BMET Recent Labs    03/06/19 1649 03/07/19 0410  NA 134* 136  K 3.5 3.5  CL 104 108  CO2 24 24  GLUCOSE 132* 108*  BUN 17 14  CREATININE 0.62 0.51  CALCIUM 9.1 9.0   LFT Recent Labs    03/07/19 0410  PROT 5.2*  ALBUMIN 2.3*  AST 152*  ALT 87*  ALKPHOS 182*  BILITOT 3.1*  Studies/Results: CT Chest W Contrast  Result Date: 03/06/2019 CLINICAL DATA:  Abnormal chest x-ray with findings suspicious for pulmonary nodules EXAM: CT CHEST, ABDOMEN, AND PELVIS WITH CONTRAST TECHNIQUE: Multidetector CT imaging of the chest, abdomen and pelvis was performed following the standard protocol during bolus administration of intravenous contrast. CONTRAST:  121mL OMNIPAQUE IOHEXOL 300 MG/ML  SOLN COMPARISON:  Chest x-ray from earlier in the same day, CT from 08/06/2005. FINDINGS: CT CHEST FINDINGS Cardiovascular: Thoracic aorta demonstrates a normal branching pattern. No aneurysmal dilatation or dissection is seen. Atherosclerotic calcifications are noted of a mild degree. Heart is mildly enlarged. Coronary calcifications are seen. The pulmonary artery shows no large central pulmonary embolus. Mediastinum/Nodes: Thoracic inlet is within normal limits. No hilar or mediastinal adenopathy is noted. The esophagus is within normal limits. Lungs/Pleura: The lungs are well aerated bilaterally. There are patchy areas of atelectasis/early infiltrate with small effusions in the lower lobes bilaterally. Multiple scattered pulmonary nodules are seen in both lungs. The largest of these on the left measures almost 10 mm in greatest dimension. The largest of these on the right measures almost 2 cm with some enhancement identified. These likely represent metastatic disease. No pneumothorax is seen. Musculoskeletal: Degenerative changes of the thoracic spine are noted. Increased  kyphosis is seen. No definitive acute rib abnormality is noted. Chronic appearing compression deformities are noted at T4, T5 and T11. CT ABDOMEN PELVIS FINDINGS Hepatobiliary: Liver is well visualized without focal mass. Slight increase in intrahepatic biliary ductal dilatation  is noted. The gallbladder is well distended with evidence of gallstones and gallbladder sludge stable from the prior exam. No pericholecystic fluid is noted. Common bile duct is dilated and slightly increased when compared with the prior exam likely related to the underlying pancreatitis. Pancreas: Pancreas demonstrates diffuse mottled enhancement with evidence of a fluid collection measuring 5.6 x 2.1 cm in greatest transverse and AP dimensions respectively along the anterior aspect of the pancreatic body. This was not present on the prior exam and is consistent with acute pancreatitis. Diffuse edema throughout the majority of the pancreas body is noted consistent with the pancreatitis. This corresponds with the significantly elevated lipase on laboratory values. Spleen: Normal in size without focal abnormality. Adrenals/Urinary Tract: Adrenal glands are within normal limits. Kidneys demonstrate a normal enhancement pattern bilaterally. Normal excretion of contrast is seen. The bladder is decompressed by Foley catheter. Previously seen left lateral bladder mass is noted but incompletely evaluated due to the decompression of the bladder. Stomach/Bowel: No obstructive or inflammatory changes of the colon are noted. The appendix is well visualized best seen on the coronal imaging and within normal limits. Small bowel is within normal limits. No gastric abnormality is seen. Vascular/Lymphatic: Aortic atherosclerosis. No enlarged abdominal or pelvic lymph nodes. Reproductive: Uterus and bilateral adnexa are unremarkable. Other: No significant free pelvic fluid is noted. Mild perihepatic fluid is noted likely related to the underlying  pancreatitis. Musculoskeletal: L1 and L3 compression deformities are again seen. Degenerative changes are noted. IMPRESSION: CT of the chest: New bibasilar atelectatic changes with small effusions. Multiple bilateral pulmonary nodules the largest of which lies in the right lower lobe posteriorly as described. Given the patient's known bladder mass disease are likely metastatic in nature. Stable compression deformities are noted. CT of the abdomen and pelvis: New changes of acute pancreatitis within the body of the pancreas with a fluid collection noted along the anterior aspect of the pancreas as well as diffuse pancreatic edema. Some mild free fluid is noted related to these changes as well. Stable dilatation of the gallbladder with gallbladder sludge and gallstones. Slight increase in the degree of intrahepatic and extrahepatic biliary ductal dilatation. This is likely related to the underlying pancreatitis. Bladder is decompressed although the known left bladder mass is again noted. Electronically Signed   By: Inez Catalina M.D.   On: 03/06/2019 21:06   CT ABDOMEN PELVIS W CONTRAST  Result Date: 03/06/2019 CLINICAL DATA:  Abnormal chest x-ray with findings suspicious for pulmonary nodules EXAM: CT CHEST, ABDOMEN, AND PELVIS WITH CONTRAST TECHNIQUE: Multidetector CT imaging of the chest, abdomen and pelvis was performed following the standard protocol during bolus administration of intravenous contrast. CONTRAST:  112mL OMNIPAQUE IOHEXOL 300 MG/ML  SOLN COMPARISON:  Chest x-ray from earlier in the same day, CT from 08/06/2005. FINDINGS: CT CHEST FINDINGS Cardiovascular: Thoracic aorta demonstrates a normal branching pattern. No aneurysmal dilatation or dissection is seen. Atherosclerotic calcifications are noted of a mild degree. Heart is mildly enlarged. Coronary calcifications are seen. The pulmonary artery shows no large central pulmonary embolus. Mediastinum/Nodes: Thoracic inlet is within normal limits.  No hilar or mediastinal adenopathy is noted. The esophagus is within normal limits. Lungs/Pleura: The lungs are well aerated bilaterally. There are patchy areas of atelectasis/early infiltrate with small effusions in the lower lobes bilaterally. Multiple scattered pulmonary nodules are seen in both lungs. The largest of these on the left measures almost 10 mm in greatest dimension. The largest of these on the right measures almost 2 cm  with some enhancement identified. These likely represent metastatic disease. No pneumothorax is seen. Musculoskeletal: Degenerative changes of the thoracic spine are noted. Increased kyphosis is seen. No definitive acute rib abnormality is noted. Chronic appearing compression deformities are noted at T4, T5 and T11. CT ABDOMEN PELVIS FINDINGS Hepatobiliary: Liver is well visualized without focal mass. Slight increase in intrahepatic biliary ductal dilatation is noted. The gallbladder is well distended with evidence of gallstones and gallbladder sludge stable from the prior exam. No pericholecystic fluid is noted. Common bile duct is dilated and slightly increased when compared with the prior exam likely related to the underlying pancreatitis. Pancreas: Pancreas demonstrates diffuse mottled enhancement with evidence of a fluid collection measuring 5.6 x 2.1 cm in greatest transverse and AP dimensions respectively along the anterior aspect of the pancreatic body. This was not present on the prior exam and is consistent with acute pancreatitis. Diffuse edema throughout the majority of the pancreas body is noted consistent with the pancreatitis. This corresponds with the significantly elevated lipase on laboratory values. Spleen: Normal in size without focal abnormality. Adrenals/Urinary Tract: Adrenal glands are within normal limits. Kidneys demonstrate a normal enhancement pattern bilaterally. Normal excretion of contrast is seen. The bladder is decompressed by Foley catheter.  Previously seen left lateral bladder mass is noted but incompletely evaluated due to the decompression of the bladder. Stomach/Bowel: No obstructive or inflammatory changes of the colon are noted. The appendix is well visualized best seen on the coronal imaging and within normal limits. Small bowel is within normal limits. No gastric abnormality is seen. Vascular/Lymphatic: Aortic atherosclerosis. No enlarged abdominal or pelvic lymph nodes. Reproductive: Uterus and bilateral adnexa are unremarkable. Other: No significant free pelvic fluid is noted. Mild perihepatic fluid is noted likely related to the underlying pancreatitis. Musculoskeletal: L1 and L3 compression deformities are again seen. Degenerative changes are noted. IMPRESSION: CT of the chest: New bibasilar atelectatic changes with small effusions. Multiple bilateral pulmonary nodules the largest of which lies in the right lower lobe posteriorly as described. Given the patient's known bladder mass disease are likely metastatic in nature. Stable compression deformities are noted. CT of the abdomen and pelvis: New changes of acute pancreatitis within the body of the pancreas with a fluid collection noted along the anterior aspect of the pancreas as well as diffuse pancreatic edema. Some mild free fluid is noted related to these changes as well. Stable dilatation of the gallbladder with gallbladder sludge and gallstones. Slight increase in the degree of intrahepatic and extrahepatic biliary ductal dilatation. This is likely related to the underlying pancreatitis. Bladder is decompressed although the known left bladder mass is again noted. Electronically Signed   By: Inez Catalina M.D.   On: 03/06/2019 21:06   Acute Abd Series  Result Date: 03/06/2019 CLINICAL DATA:  Hematuria, abdominal pain EXAM: DG ABDOMEN ACUTE W/ 1V CHEST COMPARISON:  CT 03/01/2019 FINDINGS: There is no evidence of dilated bowel loops or free intraperitoneal air. No radiopaque calculi  or other significant radiographic abnormality is seen. Lung volumes are low. Patchy bibasilar opacities may reflect atelectasis. There are more nodular densities within the right mid lung and left upper lobe, new from prior which could represent foci of infection. Metastatic disease not excluded given the findings on recent CT. IMPRESSION: 1. No evidence of bowel obstruction or free intraperitoneal air. 2. New nodular densities within the right mid lung and left upper lobe. Findings could represent infectious or inflammatory nodules. Metastatic disease not excluded given the findings on recent  CT. 3. Patchy bibasilar opacities may reflect atelectasis. Electronically Signed   By: Davina Poke M.D.   On: 03/06/2019 17:52   Impression: Pleasant 85 year old lady with recent admission for gross hematuria and Staphylococcus urinary tract infection now admitted with acute pancreatitis. Pancreatitis appears to be uncomplicated and much improved already. She appears to have been adequately hydrated. Given findings of CT and LFT pattern, I suspect pancreatitis is biliary in origin although a tiny pancreatic tumor not discernible on cross-sectional imaging would be much less likely as would other entities. There is high suspicion for bladder cancer.  Pulmonary nodules may indicate metastatic disease. Troponins are elevated. This lady's abdominal pain has improved dramatically over overnight.  She tolerated a heart healthy diet for breakfast. Unfortunately, she is getting an ultrasound while not being NPO.  Results may be limited.  Recommendations: Follow-up on ultrasound.  If study happens to pick up a common duct stone, then she would need an ERCP;  if not, she will need an MRCP. Evaluation of elevated troponins per attending. Hepatic profile tomorrow morning. Further recommendations to follow.           Notice:  This dictation was prepared with Dragon dictation along with smaller phrase  technology. Any transcriptional errors that result from this process are unintentional and may not be corrected upon review.

## 2019-03-07 NOTE — ED Notes (Signed)
Daughter in to visit.

## 2019-03-07 NOTE — Progress Notes (Signed)
PROGRESS NOTE    Brandy Fitzpatrick  [id number] DOB: 1919/04/30 DOA: 03/06/2019 PCP: Loman Brooklyn, FNP    Assessment & Plan:   Active Problems:   Pancreatitis  Brandy Fitzpatrick  is a 85 y.o. female, with medical history of hypothyroidism, vitamin B12 deficiency who was recently discharged from the hospital after she was treated for gross hematuria.  Patient was found to have bladder mass, staph aureus UTI.  Plan was to manage bladder cancer conservatively and follow-up with urology.  Foley catheter was placed. Today patient came back to hospital complaint of squeezing abdominal pain.  She had no nausea or vomiting.   # Acute pancreatitis, improved No gallstones seen on CT scan of the abdomen pelvis, she does have elevated LFTs, elevated bilirubin.  CT also shows both intra and extrahepatic ductal dilatation.  [place] ab showed Cholelithiasis without cholecystitis, and mildly dilated common bile duct at 8 mm.  Lipase much reduced since presentation. PLAN: --MRCP tomorrow --f/u GI rec --continue MIVF  # Staph aureus UTI -patient was started on doxycycline at the time of last discharge.  Started on doxycycline 100 mg IV every 12 hours on presentation. PLAN: --continue IV doxy  # Hypothyroidism -patient takes 112 mcg Synthroid at home.   --continue 55 mcg IV Synthroid daily.  # Recent diagnosis of bladder cancer -conservative management as per note from Dr. Dyann Kief from 03/04/2019.  Patient to follow-up with urology as outpatient.  Continue Foley catheter drainage.  # Recent right tibial spine avulsion fracture-right x-ray knee showed lateral tibial spine avulsion fracture, nonoperative management, weightbearing as tolerated.  # Digoxin use-patient is on digoxin with no clear reason for being on digoxin.  Will hold digoxin at this time.   DVT prophylaxis: SCD/Compression stockings Code Status: DNR  Family Communication: not today Disposition Plan: Home in 2-3  days   Subjective and Interval History:  Pt's abdominal pain much improved today.  MRCP not available today, so pt was allowed to eat and tolerated cereal.  No fever, dyspnea, chest pain, N/V/D, dysuria, increased swelling.   Objective: Vitals:   03/07/19 1415 03/07/19 1653 03/07/19 2258 03/08/19 0534  BP:  125/66 124/68 133/73  Pulse: 69 66 (!) 59 68  Resp:  20 20 18   Temp:  99.3 F (37.4 C) 98.2 F (36.8 C) 98.1 F (36.7 C)  TempSrc:  Oral Oral Oral  SpO2: 94% 93% 94% 96%  Weight:  53.1 kg    Height:  5\' 3"  (1.6 m)      Intake/Output Summary (Last 24 hours) at 03/08/2019 0757 Last data filed at 03/08/2019 0307 Gross per 24 hour  Intake 1974.74 ml  Output 1250 ml  Net 724.74 ml   Filed Weights   03/06/19 1615 03/07/19 1653  Weight: 48.2 kg 53.1 kg    Examination:   Constitutional: NAD, AAOx3 HEENT: conjunctivae and lids normal, EOMI, extremely hard of hearing CV: RRR no M,R,G. Distal pulses +2.  No cyanosis.   RESP: CTA B/L, normal respiratory effort  GI: +BS, ND, soft, some tenderness in epigastric region Extremities: No effusions, edema, or tenderness in BLE SKIN: warm, dry and intact Neuro: II - XII grossly intact.  Sensation intact Psych: Normal mood and affect.  Appropriate judgement and reason   Data Reviewed: I have personally reviewed following labs and imaging studies  CBC: Recent Labs  Lab 03/01/19 1453 03/02/19 0449 03/03/19 0523 03/06/19 1649 03/07/19 0410  WBC 5.0 4.1 11.6* 7.4 7.5  NEUTROABS 3.2  --   --  6.2  --   HGB 10.8* 9.9* 11.4* 10.5* 9.6*  HCT 36.0 32.8* 37.0 34.1* 31.1*  MCV 88.9 89.1 87.5 86.8 86.9  PLT 187 163 228 159 [id number]*   Basic Metabolic Panel: Recent Labs  Lab 03/01/19 1453 03/02/19 0449 03/06/19 1649 03/07/19 0410  NA 138 142 134* 136  K 4.3 4.0 3.5 3.5  CL 106 109 104 108  CO2 24 25 24 24   GLUCOSE 110* 87 132* 108*  BUN 21 18 17 14   CREATININE 0.74 0.56 0.62 0.51  CALCIUM 9.8 9.4 9.1 9.0   GFR: Estimated  Creatinine Clearance: 34 mL/min (by C-G formula based on SCr of 0.51 mg/dL). Liver Function Tests: Recent Labs  Lab 03/01/19 1453 03/06/19 1649 03/07/19 0410 03/08/19 0606  AST 24 160* 152* 84*  ALT 15 80* 87* 70*  ALKPHOS 96 206* 182* 171*  BILITOT 0.7 2.3* 3.1* 2.0*  PROT 6.8 6.2* 5.2* 5.3*  ALBUMIN 3.6 2.9* 2.3* 2.3*   Recent Labs  Lab 03/06/19 1649 03/07/19 0410  LIPASE 1,047* 316*   No results for input(s): AMMONIA in the last 168 hours. Coagulation Profile: No results for input(s): INR, PROTIME in the last 168 hours. Cardiac Enzymes: No results for input(s): CKTOTAL, CKMB, CKMBINDEX, TROPONINI in the last 168 hours. BNP (last 3 results) No results for input(s): PROBNP in the last 8760 hours. HbA1C: No results for input(s): HGBA1C in the last 72 hours. CBG: Recent Labs  Lab 03/03/19 1603 03/03/19 2245 03/04/19 0756 03/04/19 1146  GLUCAP 145* 113* 80 89   Lipid Profile: No results for input(s): CHOL, HDL, LDLCALC, TRIG, CHOLHDL, LDLDIRECT in the last 72 hours. Thyroid Function Tests: No results for input(s): TSH, T4TOTAL, FREET4, T3FREE, THYROIDAB in the last 72 hours. Anemia Panel: No results for input(s): VITAMINB12, FOLATE, FERRITIN, TIBC, IRON, RETICCTPCT in the last 72 hours. Sepsis Labs: No results for input(s): PROCALCITON, LATICACIDVEN in the last 168 hours.  Recent Results (from the past 240 hour(s))  Urine culture     Status: Abnormal   Collection Time: 03/01/19  1:09 PM   Specimen: Urine, Random  Result Value Ref Range Status   Specimen Description   Final    URINE, RANDOM Performed at [facility], [address] [place], [place] [address]    Special Requests   Final    NONE Performed at [facility], [address] [place], [place] [address]    Culture (A)  Final    10,000 COLONIES/mL METHICILLIN RESISTANT STAPHYLOCOCCUS AUREUS   Report Status 03/04/2019 FINAL  Final   Organism ID, Bacteria METHICILLIN RESISTANT STAPHYLOCOCCUS AUREUS  (A)  Final      Susceptibility   Methicillin resistant staphylococcus aureus - MIC*    CIPROFLOXACIN >=8 RESISTANT Resistant     GENTAMICIN <=0.5 SENSITIVE Sensitive     NITROFURANTOIN <=16 SENSITIVE Sensitive     OXACILLIN >=4 RESISTANT Resistant     TETRACYCLINE <=1 SENSITIVE Sensitive     VANCOMYCIN 1 SENSITIVE Sensitive     TRIMETH/SULFA <=10 SENSITIVE Sensitive     CLINDAMYCIN <=0.25 SENSITIVE Sensitive     RIFAMPIN <=0.5 SENSITIVE Sensitive     Inducible Clindamycin NEGATIVE Sensitive     * 10,000 COLONIES/mL METHICILLIN RESISTANT STAPHYLOCOCCUS AUREUS  SARS CORONAVIRUS 2 (TAT 6-24 HRS) Nasopharyngeal Nasopharyngeal Swab     Status: None   Collection Time: 03/01/19 10:41 PM   Specimen: Nasopharyngeal Swab  Result Value Ref Range Status   SARS Coronavirus 2 NEGATIVE NEGATIVE Final    Comment: (NOTE)  SARS-CoV-2 target nucleic acids are NOT DETECTED. The SARS-CoV-2 RNA is generally detectable in upper and lower respiratory specimens during the acute phase of infection. Negative results do not preclude SARS-CoV-2 infection, do not rule out co-infections with other pathogens, and should not be used as the sole basis for treatment or other patient management decisions. Negative results must be combined with clinical observations, patient history, and epidemiological information. The expected result is Negative. Fact Sheet for Patients: SugarRoll.be Fact Sheet for Healthcare Providers: https://www.woods-mathews.com/ This test is not yet approved or cleared by the [place] FDA and  has been authorized for detection and/or diagnosis of SARS-CoV-2 by FDA under an Emergency Use Authorization (EUA). This EUA will remain  in effect (meaning this test can be used) for the duration of the COVID-19 declaration under Section 56 4(b)(1) of the Act, 21 U.S.C. section 360bbb-3(b)(1), unless the authorization is terminated or revoked  sooner. Performed at [facility], [place] [address], [place], [place] [address]   SARS CORONAVIRUS 2 (TAT 6-24 HRS) Nasopharyngeal Nasopharyngeal Swab     Status: None   Collection Time: 03/06/19  6:32 PM   Specimen: Nasopharyngeal Swab  Result Value Ref Range Status   SARS Coronavirus 2 NEGATIVE NEGATIVE Final    Comment: (NOTE) SARS-CoV-2 target nucleic acids are NOT DETECTED. The SARS-CoV-2 RNA is generally detectable in upper and lower respiratory specimens during the acute phase of infection. Negative results do not preclude SARS-CoV-2 infection, do not rule out co-infections with other pathogens, and should not be used as the sole basis for treatment or other patient management decisions. Negative results must be combined with clinical observations, patient history, and epidemiological information. The expected result is Negative. Fact Sheet for Patients: SugarRoll.be Fact Sheet for Healthcare Providers: https://www.woods-mathews.com/ This test is not yet approved or cleared by the [place] FDA and  has been authorized for detection and/or diagnosis of SARS-CoV-2 by FDA under an Emergency Use Authorization (EUA). This EUA will remain  in effect (meaning this test can be used) for the duration of the COVID-19 declaration under Section 56 4(b)(1) of the Act, 21 U.S.C. section 360bbb-3(b)(1), unless the authorization is terminated or revoked sooner. Performed at [facility], [place] [address], [place], [place] [address]       Radiology Studies: CT Chest W Contrast  Result Date: 03/06/2019 CLINICAL DATA:  Abnormal chest x-ray with findings suspicious for pulmonary nodules EXAM: CT CHEST, ABDOMEN, AND PELVIS WITH CONTRAST TECHNIQUE: Multidetector CT imaging of the chest, abdomen and pelvis was performed following the standard protocol during bolus administration of intravenous contrast. CONTRAST:  155mL OMNIPAQUE IOHEXOL  300 MG/ML  SOLN COMPARISON:  Chest x-ray from earlier in the same day, CT from 08/06/2005. FINDINGS: CT CHEST FINDINGS Cardiovascular: Thoracic aorta demonstrates a normal branching pattern. No aneurysmal dilatation or dissection is seen. Atherosclerotic calcifications are noted of a mild degree. Heart is mildly enlarged. Coronary calcifications are seen. The pulmonary artery shows no large central pulmonary embolus. Mediastinum/Nodes: Thoracic inlet is within normal limits. No hilar or mediastinal adenopathy is noted. The esophagus is within normal limits. Lungs/Pleura: The lungs are well aerated bilaterally. There are patchy areas of atelectasis/early infiltrate with small effusions in the lower lobes bilaterally. Multiple scattered pulmonary nodules are seen in both lungs. The largest of these on the left measures almost 10 mm in greatest dimension. The largest of these on the right measures almost 2 cm with some enhancement identified. These likely represent metastatic disease. No pneumothorax  is seen. Musculoskeletal: Degenerative changes of the thoracic spine are noted. Increased kyphosis is seen. No definitive acute rib abnormality is noted. Chronic appearing compression deformities are noted at T4, T5 and T11. CT ABDOMEN PELVIS FINDINGS Hepatobiliary: Liver is well visualized without focal mass. Slight increase in intrahepatic biliary ductal dilatation is noted. The gallbladder is well distended with evidence of gallstones and gallbladder sludge stable from the prior exam. No pericholecystic fluid is noted. Common bile duct is dilated and slightly increased when compared with the prior exam likely related to the underlying pancreatitis. Pancreas: Pancreas demonstrates diffuse mottled enhancement with evidence of a fluid collection measuring 5.6 x 2.1 cm in greatest transverse and AP dimensions respectively along the anterior aspect of the pancreatic body. This was not present on the prior exam and is  consistent with acute pancreatitis. Diffuse edema throughout the majority of the pancreas body is noted consistent with the pancreatitis. This corresponds with the significantly elevated lipase on laboratory values. Spleen: Normal in size without focal abnormality. Adrenals/Urinary Tract: Adrenal glands are within normal limits. Kidneys demonstrate a normal enhancement pattern bilaterally. Normal excretion of contrast is seen. The bladder is decompressed by Foley catheter. Previously seen left lateral bladder mass is noted but incompletely evaluated due to the decompression of the bladder. Stomach/Bowel: No obstructive or inflammatory changes of the colon are noted. The appendix is well visualized best seen on the coronal imaging and within normal limits. Small bowel is within normal limits. No gastric abnormality is seen. Vascular/Lymphatic: Aortic atherosclerosis. No enlarged abdominal or pelvic lymph nodes. Reproductive: Uterus and bilateral adnexa are unremarkable. Other: No significant free pelvic fluid is noted. Mild perihepatic fluid is noted likely related to the underlying pancreatitis. Musculoskeletal: L1 and L3 compression deformities are again seen. Degenerative changes are noted. IMPRESSION: CT of the chest: New bibasilar atelectatic changes with small effusions. Multiple bilateral pulmonary nodules the largest of which lies in the right lower lobe posteriorly as described. Given the patient's known bladder mass disease are likely metastatic in nature. Stable compression deformities are noted. CT of the abdomen and pelvis: New changes of acute pancreatitis within the body of the pancreas with a fluid collection noted along the anterior aspect of the pancreas as well as diffuse pancreatic edema. Some mild free fluid is noted related to these changes as well. Stable dilatation of the gallbladder with gallbladder sludge and gallstones. Slight increase in the degree of intrahepatic and extrahepatic biliary  ductal dilatation. This is likely related to the underlying pancreatitis. Bladder is decompressed although the known left bladder mass is again noted. Electronically Signed   By: Inez Catalina M.D.   On: 03/06/2019 21:06   Abdominal ultrasound  Result Date: 03/07/2019 CLINICAL DATA:  Pancreatitis. EXAM: ABDOMEN ULTRASOUND COMPLETE COMPARISON:  March 06, 2019. FINDINGS: Gallbladder: Cholelithiasis is noted without gallbladder wall thickening or pericholecystic fluid. No sonographic Murphy's sign is noted. Common bile duct: Diameter: 8 mm which is mildly dilated. Liver: No focal lesion identified. Within normal limits in parenchymal echogenicity. Portal vein is patent on color Doppler imaging with normal direction of blood flow towards the liver. IVC: No abnormality visualized. Pancreas: Pancreatic head appears normal. Body and tail not well visualized due to overlying bowel gas. Spleen: Size and appearance within normal limits. Right Kidney: Length: 11.5 cm. Echogenicity within normal limits. No mass or hydronephrosis visualized. Left Kidney: Length: 10.3 cm. Echogenicity within normal limits. No mass or hydronephrosis visualized. Abdominal aorta: No aneurysm visualized. Other findings: None. IMPRESSION: Cholelithiasis without  cholecystitis. Pancreatic head appears normal. Body and tail not well visualized due to overlying bowel gas. Common bile duct is mildly dilated at 8 mm. Correlation with liver function tests is recommended to rule out distal common bile duct obstruction. Electronically Signed   By: Marijo Conception M.D.   On: 03/07/2019 11:46   CT ABDOMEN PELVIS W CONTRAST  Result Date: 03/06/2019 CLINICAL DATA:  Abnormal chest x-ray with findings suspicious for pulmonary nodules EXAM: CT CHEST, ABDOMEN, AND PELVIS WITH CONTRAST TECHNIQUE: Multidetector CT imaging of the chest, abdomen and pelvis was performed following the standard protocol during bolus administration of intravenous contrast.  CONTRAST:  142mL OMNIPAQUE IOHEXOL 300 MG/ML  SOLN COMPARISON:  Chest x-ray from earlier in the same day, CT from 08/06/2005. FINDINGS: CT CHEST FINDINGS Cardiovascular: Thoracic aorta demonstrates a normal branching pattern. No aneurysmal dilatation or dissection is seen. Atherosclerotic calcifications are noted of a mild degree. Heart is mildly enlarged. Coronary calcifications are seen. The pulmonary artery shows no large central pulmonary embolus. Mediastinum/Nodes: Thoracic inlet is within normal limits. No hilar or mediastinal adenopathy is noted. The esophagus is within normal limits. Lungs/Pleura: The lungs are well aerated bilaterally. There are patchy areas of atelectasis/early infiltrate with small effusions in the lower lobes bilaterally. Multiple scattered pulmonary nodules are seen in both lungs. The largest of these on the left measures almost 10 mm in greatest dimension. The largest of these on the right measures almost 2 cm with some enhancement identified. These likely represent metastatic disease. No pneumothorax is seen. Musculoskeletal: Degenerative changes of the thoracic spine are noted. Increased kyphosis is seen. No definitive acute rib abnormality is noted. Chronic appearing compression deformities are noted at T4, T5 and T11. CT ABDOMEN PELVIS FINDINGS Hepatobiliary: Liver is well visualized without focal mass. Slight increase in intrahepatic biliary ductal dilatation is noted. The gallbladder is well distended with evidence of gallstones and gallbladder sludge stable from the prior exam. No pericholecystic fluid is noted. Common bile duct is dilated and slightly increased when compared with the prior exam likely related to the underlying pancreatitis. Pancreas: Pancreas demonstrates diffuse mottled enhancement with evidence of a fluid collection measuring 5.6 x 2.1 cm in greatest transverse and AP dimensions respectively along the anterior aspect of the pancreatic body. This was not  present on the prior exam and is consistent with acute pancreatitis. Diffuse edema throughout the majority of the pancreas body is noted consistent with the pancreatitis. This corresponds with the significantly elevated lipase on laboratory values. Spleen: Normal in size without focal abnormality. Adrenals/Urinary Tract: Adrenal glands are within normal limits. Kidneys demonstrate a normal enhancement pattern bilaterally. Normal excretion of contrast is seen. The bladder is decompressed by Foley catheter. Previously seen left lateral bladder mass is noted but incompletely evaluated due to the decompression of the bladder. Stomach/Bowel: No obstructive or inflammatory changes of the colon are noted. The appendix is well visualized best seen on the coronal imaging and within normal limits. Small bowel is within normal limits. No gastric abnormality is seen. Vascular/Lymphatic: Aortic atherosclerosis. No enlarged abdominal or pelvic lymph nodes. Reproductive: Uterus and bilateral adnexa are unremarkable. Other: No significant free pelvic fluid is noted. Mild perihepatic fluid is noted likely related to the underlying pancreatitis. Musculoskeletal: L1 and L3 compression deformities are again seen. Degenerative changes are noted. IMPRESSION: CT of the chest: New bibasilar atelectatic changes with small effusions. Multiple bilateral pulmonary nodules the largest of which lies in the right lower lobe posteriorly as described. Given the  patient's known bladder mass disease are likely metastatic in nature. Stable compression deformities are noted. CT of the abdomen and pelvis: New changes of acute pancreatitis within the body of the pancreas with a fluid collection noted along the anterior aspect of the pancreas as well as diffuse pancreatic edema. Some mild free fluid is noted related to these changes as well. Stable dilatation of the gallbladder with gallbladder sludge and gallstones. Slight increase in the degree of  intrahepatic and extrahepatic biliary ductal dilatation. This is likely related to the underlying pancreatitis. Bladder is decompressed although the known left bladder mass is again noted. Electronically Signed   By: Inez Catalina M.D.   On: 03/06/2019 21:06   Acute Abd Series  Result Date: 03/06/2019 CLINICAL DATA:  Hematuria, abdominal pain EXAM: DG ABDOMEN ACUTE W/ 1V CHEST COMPARISON:  CT 03/01/2019 FINDINGS: There is no evidence of dilated bowel loops or free intraperitoneal air. No radiopaque calculi or other significant radiographic abnormality is seen. Lung volumes are low. Patchy bibasilar opacities may reflect atelectasis. There are more nodular densities within the right mid lung and left upper lobe, new from prior which could represent foci of infection. Metastatic disease not excluded given the findings on recent CT. IMPRESSION: 1. No evidence of bowel obstruction or free intraperitoneal air. 2. New nodular densities within the right mid lung and left upper lobe. Findings could represent infectious or inflammatory nodules. Metastatic disease not excluded given the findings on recent CT. 3. Patchy bibasilar opacities may reflect atelectasis. Electronically Signed   By: Davina Poke M.D.   On: 03/06/2019 17:52     Scheduled Meds: . levothyroxine  55 mcg Intravenous Daily  . pantoprazole (PROTONIX) IV  40 mg Intravenous Q24H   Continuous Infusions: . doxycycline (VIBRAMYCIN) IV 100 mg (03/07/19 2110)  . lactated ringers 75 mL/hr at 03/08/19 0307     LOS: 2 days     Enzo Bi, MD [facility] If 7PM-7AM, please contact night-coverage 03/08/2019, 7:57 AM

## 2019-03-07 NOTE — ED Notes (Signed)
Attempt call to daughter Tomie China

## 2019-03-08 ENCOUNTER — Inpatient Hospital Stay (HOSPITAL_COMMUNITY): Payer: Medicare Other

## 2019-03-08 LAB — HEPATIC FUNCTION PANEL
ALT: 70 U/L — ABNORMAL HIGH (ref 0–44)
AST: 84 U/L — ABNORMAL HIGH (ref 15–41)
Albumin: 2.3 g/dL — ABNORMAL LOW (ref 3.5–5.0)
Alkaline Phosphatase: 171 U/L — ABNORMAL HIGH (ref 38–126)
Bilirubin, Direct: 1 mg/dL — ABNORMAL HIGH (ref 0.0–0.2)
Indirect Bilirubin: 1 mg/dL — ABNORMAL HIGH (ref 0.3–0.9)
Total Bilirubin: 2 mg/dL — ABNORMAL HIGH (ref 0.3–1.2)
Total Protein: 5.3 g/dL — ABNORMAL LOW (ref 6.5–8.1)

## 2019-03-08 MED ORDER — GADOBUTROL 1 MMOL/ML IV SOLN
5.0000 mL | Freq: Once | INTRAVENOUS | Status: AC | PRN
  Administered 2019-03-08: 5 mL via INTRAVENOUS

## 2019-03-08 MED ORDER — KETOROLAC TROMETHAMINE 15 MG/ML IJ SOLN
15.0000 mg | Freq: Once | INTRAMUSCULAR | Status: AC
  Administered 2019-03-08: 15 mg via INTRAVENOUS
  Filled 2019-03-08: qty 1

## 2019-03-08 NOTE — Care Management Important Message (Signed)
Important Message  Patient Details  Name: Brandy Fitzpatrick MRN: [id number] Date of Birth: 1919/09/13   Medicare Important Message Given:  Yes(given to Network engineer for RN to deliver to patient due to contact precautions)     Tommy Medal 03/08/2019, 3:18 PM

## 2019-03-08 NOTE — Progress Notes (Addendum)
Subjective: Abdominal pain waxing and waning. LLQ pain and epigastric pain earlier this morning but now resolved. No N/V. Wonders when she can eat breakfast.   Objective: Vital signs in last 24 hours: Temp:  [98.1 F (36.7 C)-99.3 F (37.4 C)] 98.1 F (36.7 C) (12/14 0534) Pulse Rate:  [59-71] 68 (12/14 0534) Resp:  [18-20] 18 (12/14 0534) BP: (115-148)/(57-73) 133/73 (12/14 0534) SpO2:  [90 %-99 %] 96 % (12/14 0534) Weight:  [53.1 kg] 53.1 kg (12/13 1653) Last BM Date: (unsure) General:   Alert and oriented X 4, pleasant Head:  Normocephalic and atraumatic. Ears: hard of hearing. Right greater than left Abdomen:  Abdomen full, round but soft. +BS. TTP mildly epigastric. No rebound or guarding. Bowel sounds present, soft, non-tender, non-distended. No HSM or hernias noted. No rebound or guarding.  Extremities:  Without edema Psych:   Normal mood and affect.  Intake/Output from previous day: 12/13 0701 - 12/14 0700 In: 1974.7 [P.O.:240; I.V.:1234.1; IV Piggyback:500.6] Out: 1250 [Urine:1250] Intake/Output this shift: No intake/output data recorded.  Lab Results: Recent Labs    03/06/19 1649 03/07/19 0410  WBC 7.4 7.5  HGB 10.5* 9.6*  HCT 34.1* 31.1*  PLT 159 140*   BMET Recent Labs    03/06/19 1649 03/07/19 0410  NA 134* 136  K 3.5 3.5  CL 104 108  CO2 24 24  GLUCOSE 132* 108*  BUN 17 14  CREATININE 0.62 0.51  CALCIUM 9.1 9.0   LFT Recent Labs    03/06/19 1649 03/07/19 0410 03/08/19 0606  PROT 6.2* 5.2* 5.3*  ALBUMIN 2.9* 2.3* 2.3*  AST 160* 152* 84*  ALT 80* 87* 70*  ALKPHOS 206* 182* 171*  BILITOT 2.3* 3.1* 2.0*  BILIDIR  --   --  1.0*  IBILI  --   --  1.0*     Studies/Results: CT Chest W Contrast  Result Date: 03/06/2019 CLINICAL DATA:  Abnormal chest x-ray with findings suspicious for pulmonary nodules EXAM: CT CHEST, ABDOMEN, AND PELVIS WITH CONTRAST TECHNIQUE: Multidetector CT imaging of the chest, abdomen and pelvis was performed  following the standard protocol during bolus administration of intravenous contrast. CONTRAST:  131m OMNIPAQUE IOHEXOL 300 MG/ML  SOLN COMPARISON:  Chest x-ray from earlier in the same day, CT from 08/06/2005. FINDINGS: CT CHEST FINDINGS Cardiovascular: Thoracic aorta demonstrates a normal branching pattern. No aneurysmal dilatation or dissection is seen. Atherosclerotic calcifications are noted of a mild degree. Heart is mildly enlarged. Coronary calcifications are seen. The pulmonary artery shows no large central pulmonary embolus. Mediastinum/Nodes: Thoracic inlet is within normal limits. No hilar or mediastinal adenopathy is noted. The esophagus is within normal limits. Lungs/Pleura: The lungs are well aerated bilaterally. There are patchy areas of atelectasis/early infiltrate with small effusions in the lower lobes bilaterally. Multiple scattered pulmonary nodules are seen in both lungs. The largest of these on the left measures almost 10 mm in greatest dimension. The largest of these on the right measures almost 2 cm with some enhancement identified. These likely represent metastatic disease. No pneumothorax is seen. Musculoskeletal: Degenerative changes of the thoracic spine are noted. Increased kyphosis is seen. No definitive acute rib abnormality is noted. Chronic appearing compression deformities are noted at T4, T5 and T11. CT ABDOMEN PELVIS FINDINGS Hepatobiliary: Liver is well visualized without focal mass. Slight increase in intrahepatic biliary ductal dilatation is noted. The gallbladder is well distended with evidence of gallstones and gallbladder sludge stable from the prior exam. No pericholecystic fluid is noted. Common  bile duct is dilated and slightly increased when compared with the prior exam likely related to the underlying pancreatitis. Pancreas: Pancreas demonstrates diffuse mottled enhancement with evidence of a fluid collection measuring 5.6 x 2.1 cm in greatest transverse and AP  dimensions respectively along the anterior aspect of the pancreatic body. This was not present on the prior exam and is consistent with acute pancreatitis. Diffuse edema throughout the majority of the pancreas body is noted consistent with the pancreatitis. This corresponds with the significantly elevated lipase on laboratory values. Spleen: Normal in size without focal abnormality. Adrenals/Urinary Tract: Adrenal glands are within normal limits. Kidneys demonstrate a normal enhancement pattern bilaterally. Normal excretion of contrast is seen. The bladder is decompressed by Foley catheter. Previously seen left lateral bladder mass is noted but incompletely evaluated due to the decompression of the bladder. Stomach/Bowel: No obstructive or inflammatory changes of the colon are noted. The appendix is well visualized best seen on the coronal imaging and within normal limits. Small bowel is within normal limits. No gastric abnormality is seen. Vascular/Lymphatic: Aortic atherosclerosis. No enlarged abdominal or pelvic lymph nodes. Reproductive: Uterus and bilateral adnexa are unremarkable. Other: No significant free pelvic fluid is noted. Mild perihepatic fluid is noted likely related to the underlying pancreatitis. Musculoskeletal: L1 and L3 compression deformities are again seen. Degenerative changes are noted. IMPRESSION: CT of the chest: New bibasilar atelectatic changes with small effusions. Multiple bilateral pulmonary nodules the largest of which lies in the right lower lobe posteriorly as described. Given the patient's known bladder mass disease are likely metastatic in nature. Stable compression deformities are noted. CT of the abdomen and pelvis: New changes of acute pancreatitis within the body of the pancreas with a fluid collection noted along the anterior aspect of the pancreas as well as diffuse pancreatic edema. Some mild free fluid is noted related to these changes as well. Stable dilatation of the  gallbladder with gallbladder sludge and gallstones. Slight increase in the degree of intrahepatic and extrahepatic biliary ductal dilatation. This is likely related to the underlying pancreatitis. Bladder is decompressed although the known left bladder mass is again noted. Electronically Signed   By: Inez Catalina M.D.   On: 03/06/2019 21:06   Abdominal ultrasound  Result Date: 03/07/2019 CLINICAL DATA:  Pancreatitis. EXAM: ABDOMEN ULTRASOUND COMPLETE COMPARISON:  March 06, 2019. FINDINGS: Gallbladder: Cholelithiasis is noted without gallbladder wall thickening or pericholecystic fluid. No sonographic Murphy's sign is noted. Common bile duct: Diameter: 8 mm which is mildly dilated. Liver: No focal lesion identified. Within normal limits in parenchymal echogenicity. Portal vein is patent on color Doppler imaging with normal direction of blood flow towards the liver. IVC: No abnormality visualized. Pancreas: Pancreatic head appears normal. Body and tail not well visualized due to overlying bowel gas. Spleen: Size and appearance within normal limits. Right Kidney: Length: 11.5 cm. Echogenicity within normal limits. No mass or hydronephrosis visualized. Left Kidney: Length: 10.3 cm. Echogenicity within normal limits. No mass or hydronephrosis visualized. Abdominal aorta: No aneurysm visualized. Other findings: None. IMPRESSION: Cholelithiasis without cholecystitis. Pancreatic head appears normal. Body and tail not well visualized due to overlying bowel gas. Common bile duct is mildly dilated at 8 mm. Correlation with liver function tests is recommended to rule out distal common bile duct obstruction. Electronically Signed   By: Marijo Conception M.D.   On: 03/07/2019 11:46   CT ABDOMEN PELVIS W CONTRAST  Result Date: 03/06/2019 CLINICAL DATA:  Abnormal chest x-ray with findings suspicious for pulmonary  nodules EXAM: CT CHEST, ABDOMEN, AND PELVIS WITH CONTRAST TECHNIQUE: Multidetector CT imaging of the chest,  abdomen and pelvis was performed following the standard protocol during bolus administration of intravenous contrast. CONTRAST:  144m OMNIPAQUE IOHEXOL 300 MG/ML  SOLN COMPARISON:  Chest x-ray from earlier in the same day, CT from 08/06/2005. FINDINGS: CT CHEST FINDINGS Cardiovascular: Thoracic aorta demonstrates a normal branching pattern. No aneurysmal dilatation or dissection is seen. Atherosclerotic calcifications are noted of a mild degree. Heart is mildly enlarged. Coronary calcifications are seen. The pulmonary artery shows no large central pulmonary embolus. Mediastinum/Nodes: Thoracic inlet is within normal limits. No hilar or mediastinal adenopathy is noted. The esophagus is within normal limits. Lungs/Pleura: The lungs are well aerated bilaterally. There are patchy areas of atelectasis/early infiltrate with small effusions in the lower lobes bilaterally. Multiple scattered pulmonary nodules are seen in both lungs. The largest of these on the left measures almost 10 mm in greatest dimension. The largest of these on the right measures almost 2 cm with some enhancement identified. These likely represent metastatic disease. No pneumothorax is seen. Musculoskeletal: Degenerative changes of the thoracic spine are noted. Increased kyphosis is seen. No definitive acute rib abnormality is noted. Chronic appearing compression deformities are noted at T4, T5 and T11. CT ABDOMEN PELVIS FINDINGS Hepatobiliary: Liver is well visualized without focal mass. Slight increase in intrahepatic biliary ductal dilatation is noted. The gallbladder is well distended with evidence of gallstones and gallbladder sludge stable from the prior exam. No pericholecystic fluid is noted. Common bile duct is dilated and slightly increased when compared with the prior exam likely related to the underlying pancreatitis. Pancreas: Pancreas demonstrates diffuse mottled enhancement with evidence of a fluid collection measuring 5.6 x 2.1 cm in  greatest transverse and AP dimensions respectively along the anterior aspect of the pancreatic body. This was not present on the prior exam and is consistent with acute pancreatitis. Diffuse edema throughout the majority of the pancreas body is noted consistent with the pancreatitis. This corresponds with the significantly elevated lipase on laboratory values. Spleen: Normal in size without focal abnormality. Adrenals/Urinary Tract: Adrenal glands are within normal limits. Kidneys demonstrate a normal enhancement pattern bilaterally. Normal excretion of contrast is seen. The bladder is decompressed by Foley catheter. Previously seen left lateral bladder mass is noted but incompletely evaluated due to the decompression of the bladder. Stomach/Bowel: No obstructive or inflammatory changes of the colon are noted. The appendix is well visualized best seen on the coronal imaging and within normal limits. Small bowel is within normal limits. No gastric abnormality is seen. Vascular/Lymphatic: Aortic atherosclerosis. No enlarged abdominal or pelvic lymph nodes. Reproductive: Uterus and bilateral adnexa are unremarkable. Other: No significant free pelvic fluid is noted. Mild perihepatic fluid is noted likely related to the underlying pancreatitis. Musculoskeletal: L1 and L3 compression deformities are again seen. Degenerative changes are noted. IMPRESSION: CT of the chest: New bibasilar atelectatic changes with small effusions. Multiple bilateral pulmonary nodules the largest of which lies in the right lower lobe posteriorly as described. Given the patient's known bladder mass disease are likely metastatic in nature. Stable compression deformities are noted. CT of the abdomen and pelvis: New changes of acute pancreatitis within the body of the pancreas with a fluid collection noted along the anterior aspect of the pancreas as well as diffuse pancreatic edema. Some mild free fluid is noted related to these changes as well.  Stable dilatation of the gallbladder with gallbladder sludge and gallstones. Slight increase in the  degree of intrahepatic and extrahepatic biliary ductal dilatation. This is likely related to the underlying pancreatitis. Bladder is decompressed although the known left bladder mass is again noted. Electronically Signed   By: Inez Catalina M.D.   On: 03/06/2019 21:06   Acute Abd Series  Result Date: 03/06/2019 CLINICAL DATA:  Hematuria, abdominal pain EXAM: DG ABDOMEN ACUTE W/ 1V CHEST COMPARISON:  CT 03/01/2019 FINDINGS: There is no evidence of dilated bowel loops or free intraperitoneal air. No radiopaque calculi or other significant radiographic abnormality is seen. Lung volumes are low. Patchy bibasilar opacities may reflect atelectasis. There are more nodular densities within the right mid lung and left upper lobe, new from prior which could represent foci of infection. Metastatic disease not excluded given the findings on recent CT. IMPRESSION: 1. No evidence of bowel obstruction or free intraperitoneal air. 2. New nodular densities within the right mid lung and left upper lobe. Findings could represent infectious or inflammatory nodules. Metastatic disease not excluded given the findings on recent CT. 3. Patchy bibasilar opacities may reflect atelectasis. Electronically Signed   By: Davina Poke M.D.   On: 03/06/2019 17:52    Assessment: Very pleasant 85 year old female admitted with uncomplicated pancreatitis and concern for biliary origin due to elevated LFTs and CT imaging. [place] yesterday with cholelithiasis, and CBD mildly dilated a 8 mm. LFTs improving since admission, with transaminases and alk phos trending down; total bilirubin improved from yesterday, with primarily direct bilirubin elevated.  MRCP is planned today to further evaluate for occult stone. Unable to rule out occult malignancy but less likely. Clinically stable, with some waxing and waning of pain but no worsening of symptoms.  Currently without pain at time of assessment today. Remains NPO.   Concern for bladder cancer: bladder mass on CT. Multiple pulmonary nodules raise concern for metastatic disease. Urology has previously evaluated during last hospitalization.    Plan: MRCP today NPO for now until MRCP reviewed. May be able to advance to clear liquids later today if continues to improve symptomatically If evidence for choledocholithiasis, will need ERCP Continue IVFs Repeat HFP, CBC, lipase tomorrow Further recommendations to follow Follow-up with Urology as outpatient   Annitta Needs, PhD, ANP-BC [facility]    LOS: 2 days    03/08/2019, 8:39 AM

## 2019-03-08 NOTE — Progress Notes (Signed)
MRCP with choledocholithiasis and mild biliary dilation. Patient will need ERCP with stone extraction.  I contacted patient's daughter, Brandy Fitzpatrick, and spoke with her on the phone this afternoon. Discussed risks and benefits with daughter, who states understanding. No concerns.  Spoke with patient and reviewed ERCP. Discussed risks and benefits. States understanding. Doing well now without pain. Will have clear liquids this evening and NPO after midnight.  Reviewed with Dr. Laural Golden. Barring any anesthesia concerns, will pursue ERCP tomorrow. Unasyn IV on call for procedure.

## 2019-03-08 NOTE — Progress Notes (Signed)
PROGRESS NOTE    Brandy Fitzpatrick  [id number] DOB: 1919/06/13 DOA: 03/06/2019 PCP: Brandy Brooklyn, FNP    Assessment & Plan:   Active Problems:   Pancreatitis  Brandy Fitzpatrick  is a 85 y.o. female, with medical history of hypothyroidism, vitamin B12 deficiency who was recently discharged from the hospital after she was treated for gross hematuria.  Patient was found to have bladder mass, staph aureus UTI.  Plan was to manage bladder cancer conservatively and follow-up with urology.  Foley catheter was placed. Today patient came back to hospital complaint of squeezing abdominal pain.  She had no nausea or vomiting.   # Acute pancreatitis 2/2 choledocholithiasis, improved No gallstones seen on CT scan of the abdomen pelvis, she does have elevated LFTs, elevated bilirubin.  CT also shows both intra and extrahepatic ductal dilatation.  [place] ab showed Cholelithiasis without cholecystitis, and mildly dilated common bile duct at 8 mm.  Lipase much reduced since presentation. PLAN: --MRCP today showed choledocholithiasis  --ERCP tomorrow --continue MIVF  # Staph aureus UTI, POA -patient was started on doxycycline at the time of last discharge.  Started on doxycycline 100 mg IV every 12 hours on presentation. PLAN: --continue IV doxy  # Hypothyroidism -patient takes 112 mcg Synthroid at home.   --continue 55 mcg IV Synthroid daily.  # Recent diagnosis of bladder cancer # Hematuria  -conservative management as per note from Dr. Dyann Kief from 03/04/2019.  Mild hematuria noted likely due to bladder cancer. --Patient to follow-up with urology as outpatient. --Continue Foley catheter drainage.  # Recent right tibial spine avulsion fracture -right x-ray knee showed lateral tibial spine avulsion fracture, nonoperative management, weightbearing as tolerated.  # Digoxin use -patient is on digoxin with no clear reason for being on digoxin.  Will hold digoxin at this time.  # Mild trop  elevation due to demand ischemia --trop 44 and then 46 on presentation.  Not ACS.   DVT prophylaxis: SCD/Compression stockings Code Status: DNR  Family Communication: not today Disposition Plan: Unknown at this time   Subjective and Interval History:  Pt reported soreness in her abdomen, but not severe, and has been eating/drinking.  No fever, dyspnea, chest pain, N/V/D, dysuria, increased swelling.  Urine noted to be pink, with what looked like small pieces of tissues.    MRCP showed choledocholithiasis and will go for ERCP tomorrow.   Objective: Vitals:   03/07/19 1653 03/07/19 2258 03/08/19 0534 03/08/19 1928  BP: 125/66 124/68 133/73   Pulse: 66 (!) 59 68   Resp: 20 20 18    Temp: 99.3 F (37.4 C) 98.2 F (36.8 C) 98.1 F (36.7 C)   TempSrc: Oral Oral Oral   SpO2: 93% 94% 96% 92%  Weight: 53.1 kg     Height: 5\' 3"  (1.6 m)       Intake/Output Summary (Last 24 hours) at 03/08/2019 2046 Last data filed at 03/08/2019 1800 Gross per 24 hour  Intake 1575.71 ml  Output 1600 ml  Net -24.29 ml   Filed Weights   03/06/19 1615 03/07/19 1653  Weight: 48.2 kg 53.1 kg    Examination:   Constitutional: NAD, AAOx3 HEENT: conjunctivae and lids normal, EOMI, extremely hard of hearing CV: RRR no M,R,G. Distal pulses +2.  No cyanosis.   RESP: CTA B/L, normal respiratory effort  GI: +BS, ND, soft, some tenderness in epigastric region Extremities: No effusions, edema, or tenderness in BLE SKIN: warm, dry and intact Neuro: II - XII grossly intact.  Sensation intact Psych: Normal mood and affect.  Appropriate judgement and reason   Data Reviewed: I have personally reviewed following labs and imaging studies  CBC: Recent Labs  Lab 03/02/19 0449 03/03/19 0523 03/06/19 1649 03/07/19 0410  WBC 4.1 11.6* 7.4 7.5  NEUTROABS  --   --  6.2  --   HGB 9.9* 11.4* 10.5* 9.6*  HCT 32.8* 37.0 34.1* 31.1*  MCV 89.1 87.5 86.8 86.9  PLT 163 228 159 [id number]*   Basic Metabolic  Panel: Recent Labs  Lab 03/02/19 0449 03/06/19 1649 03/07/19 0410  NA 142 134* 136  K 4.0 3.5 3.5  CL 109 104 108  CO2 25 24 24   GLUCOSE 87 132* 108*  BUN 18 17 14   CREATININE 0.56 0.62 0.51  CALCIUM 9.4 9.1 9.0   GFR: Estimated Creatinine Clearance: 34 mL/min (by C-G formula based on SCr of 0.51 mg/dL). Liver Function Tests: Recent Labs  Lab 03/06/19 1649 03/07/19 0410 03/08/19 0606  AST 160* 152* 84*  ALT 80* 87* 70*  ALKPHOS 206* 182* 171*  BILITOT 2.3* 3.1* 2.0*  PROT 6.2* 5.2* 5.3*  ALBUMIN 2.9* 2.3* 2.3*   Recent Labs  Lab 03/06/19 1649 03/07/19 0410  LIPASE 1,047* 316*   No results for input(s): AMMONIA in the last 168 hours. Coagulation Profile: No results for input(s): INR, PROTIME in the last 168 hours. Cardiac Enzymes: No results for input(s): CKTOTAL, CKMB, CKMBINDEX, TROPONINI in the last 168 hours. BNP (last 3 results) No results for input(s): PROBNP in the last 8760 hours. HbA1C: No results for input(s): HGBA1C in the last 72 hours. CBG: Recent Labs  Lab 03/03/19 1603 03/03/19 2245 03/04/19 0756 03/04/19 1146  GLUCAP 145* 113* 80 89   Lipid Profile: No results for input(s): CHOL, HDL, LDLCALC, TRIG, CHOLHDL, LDLDIRECT in the last 72 hours. Thyroid Function Tests: No results for input(s): TSH, T4TOTAL, FREET4, T3FREE, THYROIDAB in the last 72 hours. Anemia Panel: No results for input(s): VITAMINB12, FOLATE, FERRITIN, TIBC, IRON, RETICCTPCT in the last 72 hours. Sepsis Labs: No results for input(s): PROCALCITON, LATICACIDVEN in the last 168 hours.  Recent Results (from the past 240 hour(s))  Urine culture     Status: Abnormal   Collection Time: 03/01/19  1:09 PM   Specimen: Urine, Random  Result Value Ref Range Status   Specimen Description   Final    URINE, RANDOM Performed at [facility], [address], [place], [place] [address]    Special Requests   Final    NONE Performed at [facility], [address] [place], [place]  [address]    Culture (A)  Final    10,000 COLONIES/mL METHICILLIN RESISTANT STAPHYLOCOCCUS AUREUS   Report Status 03/04/2019 FINAL  Final   Organism ID, Bacteria METHICILLIN RESISTANT STAPHYLOCOCCUS AUREUS (A)  Final      Susceptibility   Methicillin resistant staphylococcus aureus - MIC*    CIPROFLOXACIN >=8 RESISTANT Resistant     GENTAMICIN <=0.5 SENSITIVE Sensitive     NITROFURANTOIN <=16 SENSITIVE Sensitive     OXACILLIN >=4 RESISTANT Resistant     TETRACYCLINE <=1 SENSITIVE Sensitive     VANCOMYCIN 1 SENSITIVE Sensitive     TRIMETH/SULFA <=10 SENSITIVE Sensitive     CLINDAMYCIN <=0.25 SENSITIVE Sensitive     RIFAMPIN <=0.5 SENSITIVE Sensitive     Inducible Clindamycin NEGATIVE Sensitive     * 10,000 COLONIES/mL METHICILLIN RESISTANT STAPHYLOCOCCUS AUREUS  SARS CORONAVIRUS 2 (TAT 6-24 HRS) Nasopharyngeal Nasopharyngeal Swab     Status: None  Collection Time: 03/01/19 10:41 PM   Specimen: Nasopharyngeal Swab  Result Value Ref Range Status   SARS Coronavirus 2 NEGATIVE NEGATIVE Final    Comment: (NOTE) SARS-CoV-2 target nucleic acids are NOT DETECTED. The SARS-CoV-2 RNA is generally detectable in upper and lower respiratory specimens during the acute phase of infection. Negative results do not preclude SARS-CoV-2 infection, do not rule out co-infections with other pathogens, and should not be used as the sole basis for treatment or other patient management decisions. Negative results must be combined with clinical observations, patient history, and epidemiological information. The expected result is Negative. Fact Sheet for Patients: SugarRoll.be Fact Sheet for Healthcare Providers: https://www.woods-mathews.com/ This test is not yet approved or cleared by the [place] FDA and  has been authorized for detection and/or diagnosis of SARS-CoV-2 by FDA under an Emergency Use Authorization (EUA). This EUA will remain  in effect (meaning  this test can be used) for the duration of the COVID-19 declaration under Section 56 4(b)(1) of the Act, 21 U.S.C. section 360bbb-3(b)(1), unless the authorization is terminated or revoked sooner. Performed at [facility], [place] [address], [place], [place] [address]   SARS CORONAVIRUS 2 (TAT 6-24 HRS) Nasopharyngeal Nasopharyngeal Swab     Status: None   Collection Time: 03/06/19  6:32 PM   Specimen: Nasopharyngeal Swab  Result Value Ref Range Status   SARS Coronavirus 2 NEGATIVE NEGATIVE Final    Comment: (NOTE) SARS-CoV-2 target nucleic acids are NOT DETECTED. The SARS-CoV-2 RNA is generally detectable in upper and lower respiratory specimens during the acute phase of infection. Negative results do not preclude SARS-CoV-2 infection, do not rule out co-infections with other pathogens, and should not be used as the sole basis for treatment or other patient management decisions. Negative results must be combined with clinical observations, patient history, and epidemiological information. The expected result is Negative. Fact Sheet for Patients: SugarRoll.be Fact Sheet for Healthcare Providers: https://www.woods-mathews.com/ This test is not yet approved or cleared by the [place] FDA and  has been authorized for detection and/or diagnosis of SARS-CoV-2 by FDA under an Emergency Use Authorization (EUA). This EUA will remain  in effect (meaning this test can be used) for the duration of the COVID-19 declaration under Section 56 4(b)(1) of the Act, 21 U.S.C. section 360bbb-3(b)(1), unless the authorization is terminated or revoked sooner. Performed at [facility], [place] [address], [place], [place] [address]       Radiology Studies: Abdominal ultrasound  Result Date: 03/07/2019 CLINICAL DATA:  Pancreatitis. EXAM: ABDOMEN ULTRASOUND COMPLETE COMPARISON:  March 06, 2019. FINDINGS: Gallbladder: Cholelithiasis is noted  without gallbladder wall thickening or pericholecystic fluid. No sonographic Murphy's sign is noted. Common bile duct: Diameter: 8 mm which is mildly dilated. Liver: No focal lesion identified. Within normal limits in parenchymal echogenicity. Portal vein is patent on color Doppler imaging with normal direction of blood flow towards the liver. IVC: No abnormality visualized. Pancreas: Pancreatic head appears normal. Body and tail not well visualized due to overlying bowel gas. Spleen: Size and appearance within normal limits. Right Kidney: Length: 11.5 cm. Echogenicity within normal limits. No mass or hydronephrosis visualized. Left Kidney: Length: 10.3 cm. Echogenicity within normal limits. No mass or hydronephrosis visualized. Abdominal aorta: No aneurysm visualized. Other findings: None. IMPRESSION: Cholelithiasis without cholecystitis. Pancreatic head appears normal. Body and tail not well visualized due to overlying bowel gas. Common bile duct is mildly dilated at 8 mm. Correlation with liver function tests is recommended to  rule out distal common bile duct obstruction. Electronically Signed   By: Marijo Conception M.D.   On: 03/07/2019 11:46   MR 3D Recon At Scanner  Result Date: 03/08/2019 CLINICAL DATA:  Abdominal pain with jaundice for 2 months. History of gallstones. EXAM: MRI ABDOMEN WITHOUT AND WITH CONTRAST (INCLUDING MRCP) TECHNIQUE: Multiplanar multisequence MR imaging of the abdomen was performed both before and after the administration of intravenous contrast. Heavily T2-weighted images of the biliary and pancreatic ducts were obtained, and three-dimensional MRCP images were rendered by post processing. CONTRAST:  15mL GADAVIST GADOBUTROL 1 MMOL/ML IV SOLN COMPARISON:  Abdominopelvic CT 03/06/2019 abdominal ultrasound 03/07/2019 FINDINGS: Despite efforts by the technologist and patient, moderate motion artifact is present on today's exam and could not be eliminated. This reduces exam sensitivity  and specificity. Lower chest: There are small bilateral pleural effusions with dependent atelectasis at both lung bases. The heart is moderately enlarged. No significant pericardial effusion. Hepatobiliary: There are probable small hepatic cysts. No enhancing liver lesions are identified. The gallbladder is filled with multiple small gallstones. There is mild intra and extrahepatic biliary dilatation with the common hepatic duct measuring up to 9 mm in diameter. There are multiple small stones within the common bile duct, best seen on series 8. Pancreas: As seen on recent CT, there is heterogeneous enlargement the proximal pancreatic tail with a complex peripherally enhancing fluid collection in the lesser sac, measuring up to 6.6 x 2.7 cm (image 65/17). No obvious pancreatic mass or pancreatic ductal dilatation, although evaluation limited by motion. Spleen: Normal in size without focal abnormality. Adrenals/Urinary Tract: Both adrenal glands appear normal. No significant abnormalities of either kidney. There is no hydronephrosis. Stomach/Bowel: No evidence of bowel wall thickening, distention or surrounding inflammatory change. Vascular/Lymphatic: There are no enlarged abdominal lymph nodes. Aortic atherosclerosis without evidence of aneurysm. Other: Mild generalized soft tissue edema with a small amount of ascites. No other focal extraluminal fluid collections are seen. Musculoskeletal: There is a convex right thoracolumbar scoliosis associated with multiple compression deformities involving the T11, L1 and L3 vertebral bodies, similar to recent CT. Inferior endplate edema and enhancement are present on the left in the L4 and L5 vertebral bodies. There is corresponding sclerosis in the L5 vertebral body on recent CT, but no gross lytic lesion. IMPRESSION: 1. Cholelithiasis with choledocholithiasis and mild biliary dilatation. 2. Persistent heterogeneous enlargement of the proximal pancreatic tail with complex  peripherally enhancing fluid collection in the lesser sac, similar to recent CT, and most consistent with pancreatitis. Patient has corresponding elevated serum lipase levels. These pancreatic findings are not well seen by this examination, in part due to motion artifact. CT follow-up recommended to exclude neoplasm. 3. Mild generalized soft tissue edema and small bilateral pleural effusions with bibasilar atelectasis. 4. Grossly stable thoracolumbar compression deformities. Indeterminate inferior endplate edema and enhancement in the L4 and L5 vertebral bodies. Electronically Signed   By: Richardean Sale M.D.   On: 03/08/2019 15:33   MR ABDOMEN MRCP W WO CONTAST  Result Date: 03/08/2019 CLINICAL DATA:  Abdominal pain with jaundice for 2 months. History of gallstones. EXAM: MRI ABDOMEN WITHOUT AND WITH CONTRAST (INCLUDING MRCP) TECHNIQUE: Multiplanar multisequence MR imaging of the abdomen was performed both before and after the administration of intravenous contrast. Heavily T2-weighted images of the biliary and pancreatic ducts were obtained, and three-dimensional MRCP images were rendered by post processing. CONTRAST:  77mL GADAVIST GADOBUTROL 1 MMOL/ML IV SOLN COMPARISON:  Abdominopelvic CT 03/06/2019 abdominal ultrasound 03/07/2019 FINDINGS:  Despite efforts by the technologist and patient, moderate motion artifact is present on today's exam and could not be eliminated. This reduces exam sensitivity and specificity. Lower chest: There are small bilateral pleural effusions with dependent atelectasis at both lung bases. The heart is moderately enlarged. No significant pericardial effusion. Hepatobiliary: There are probable small hepatic cysts. No enhancing liver lesions are identified. The gallbladder is filled with multiple small gallstones. There is mild intra and extrahepatic biliary dilatation with the common hepatic duct measuring up to 9 mm in diameter. There are multiple small stones within the common  bile duct, best seen on series 8. Pancreas: As seen on recent CT, there is heterogeneous enlargement the proximal pancreatic tail with a complex peripherally enhancing fluid collection in the lesser sac, measuring up to 6.6 x 2.7 cm (image 65/17). No obvious pancreatic mass or pancreatic ductal dilatation, although evaluation limited by motion. Spleen: Normal in size without focal abnormality. Adrenals/Urinary Tract: Both adrenal glands appear normal. No significant abnormalities of either kidney. There is no hydronephrosis. Stomach/Bowel: No evidence of bowel wall thickening, distention or surrounding inflammatory change. Vascular/Lymphatic: There are no enlarged abdominal lymph nodes. Aortic atherosclerosis without evidence of aneurysm. Other: Mild generalized soft tissue edema with a small amount of ascites. No other focal extraluminal fluid collections are seen. Musculoskeletal: There is a convex right thoracolumbar scoliosis associated with multiple compression deformities involving the T11, L1 and L3 vertebral bodies, similar to recent CT. Inferior endplate edema and enhancement are present on the left in the L4 and L5 vertebral bodies. There is corresponding sclerosis in the L5 vertebral body on recent CT, but no gross lytic lesion. IMPRESSION: 1. Cholelithiasis with choledocholithiasis and mild biliary dilatation. 2. Persistent heterogeneous enlargement of the proximal pancreatic tail with complex peripherally enhancing fluid collection in the lesser sac, similar to recent CT, and most consistent with pancreatitis. Patient has corresponding elevated serum lipase levels. These pancreatic findings are not well seen by this examination, in part due to motion artifact. CT follow-up recommended to exclude neoplasm. 3. Mild generalized soft tissue edema and small bilateral pleural effusions with bibasilar atelectasis. 4. Grossly stable thoracolumbar compression deformities. Indeterminate inferior endplate edema  and enhancement in the L4 and L5 vertebral bodies. Electronically Signed   By: Richardean Sale M.D.   On: 03/08/2019 15:33     Scheduled Meds: . levothyroxine  55 mcg Intravenous Daily  . pantoprazole (PROTONIX) IV  40 mg Intravenous Q24H   Continuous Infusions: . doxycycline (VIBRAMYCIN) IV 100 mg (03/08/19 1046)  . lactated ringers 75 mL/hr at 03/08/19 1443     LOS: 2 days     Enzo Bi, MD [facility] If 7PM-7AM, please contact night-coverage 03/08/2019, 8:46 PM

## 2019-03-09 ENCOUNTER — Encounter (HOSPITAL_COMMUNITY)
Admission: EM | Disposition: A | Discharge status: Hospice | Payer: Self-pay | Source: Home / Self Care | Attending: Hospitalist

## 2019-03-09 ENCOUNTER — Encounter (HOSPITAL_COMMUNITY): Payer: Self-pay | Admitting: Anesthesiology

## 2019-03-09 DIAGNOSIS — K8050 Calculus of bile duct without cholangitis or cholecystitis without obstruction: Secondary | ICD-10-CM

## 2019-03-09 DIAGNOSIS — K8020 Calculus of gallbladder without cholecystitis without obstruction: Secondary | ICD-10-CM

## 2019-03-09 DIAGNOSIS — K859 Acute pancreatitis without necrosis or infection, unspecified: Secondary | ICD-10-CM

## 2019-03-09 LAB — CBC WITH DIFFERENTIAL/PLATELET
Abs Immature Granulocytes: 0.12 10*3/uL — ABNORMAL HIGH (ref 0.00–0.07)
Basophils Absolute: 0.1 10*3/uL (ref 0.0–0.1)
Basophils Relative: 1 %
Eosinophils Absolute: 0.1 10*3/uL (ref 0.0–0.5)
Eosinophils Relative: 1 %
HCT: 33.8 % — ABNORMAL LOW (ref 36.0–46.0)
Hemoglobin: 10.7 g/dL — ABNORMAL LOW (ref 12.0–15.0)
Immature Granulocytes: 1 %
Lymphocytes Relative: 8 %
Lymphs Abs: 1 10*3/uL (ref 0.7–4.0)
MCH: 27.1 pg (ref 26.0–34.0)
MCHC: 31.7 g/dL (ref 30.0–36.0)
MCV: 85.6 fL (ref 80.0–100.0)
Monocytes Absolute: 0.9 10*3/uL (ref 0.1–1.0)
Monocytes Relative: 7 %
Neutro Abs: 10.4 10*3/uL — ABNORMAL HIGH (ref 1.7–7.7)
Neutrophils Relative %: 82 %
Platelets: 180 10*3/uL (ref 150–400)
RBC: 3.95 MIL/uL (ref 3.87–5.11)
RDW: 15.3 % (ref 11.5–15.5)
WBC: 12.6 10*3/uL — ABNORMAL HIGH (ref 4.0–10.5)
nRBC: 0 % (ref 0.0–0.2)

## 2019-03-09 LAB — BASIC METABOLIC PANEL
Anion gap: 8 (ref 5–15)
BUN: 13 mg/dL (ref 8–23)
CO2: 23 mmol/L (ref 22–32)
Calcium: 8.8 mg/dL — ABNORMAL LOW (ref 8.9–10.3)
Chloride: 102 mmol/L (ref 98–111)
Creatinine, Ser: 0.62 mg/dL (ref 0.44–1.00)
GFR calc Af Amer: >60 mL/min (ref >60)
GFR calc non Af Amer: >60 mL/min (ref >60)
Glucose, Bld: 82 mg/dL (ref 70–99)
Potassium: 3.7 mmol/L (ref 3.5–5.1)
Sodium: 133 mmol/L — ABNORMAL LOW (ref 135–145)

## 2019-03-09 LAB — HEPATIC FUNCTION PANEL
ALT: 50 U/L — ABNORMAL HIGH (ref 0–44)
AST: 43 U/L — ABNORMAL HIGH (ref 15–41)
Albumin: 2.2 g/dL — ABNORMAL LOW (ref 3.5–5.0)
Alkaline Phosphatase: 176 U/L — ABNORMAL HIGH (ref 38–126)
Bilirubin, Direct: 0.7 mg/dL — ABNORMAL HIGH (ref 0.0–0.2)
Indirect Bilirubin: 1.1 mg/dL — ABNORMAL HIGH (ref 0.3–0.9)
Total Bilirubin: 1.8 mg/dL — ABNORMAL HIGH (ref 0.3–1.2)
Total Protein: 5.3 g/dL — ABNORMAL LOW (ref 6.5–8.1)

## 2019-03-09 LAB — LIPASE, BLOOD: Lipase: 143 U/L — ABNORMAL HIGH (ref 11–51)

## 2019-03-09 SURGERY — ENDOSCOPIC RETROGRADE CHOLANGIOPANCREATOGRAPHY (ERCP) WITH PROPOFOL
Anesthesia: Monitor Anesthesia Care

## 2019-03-09 MED ORDER — SODIUM CHLORIDE 0.9 % IV SOLN: INTRAVENOUS | Status: DC

## 2019-03-09 MED ORDER — SODIUM CHLORIDE 0.9 % IV SOLN
1.5000 g | Freq: Once | INTRAVENOUS | Status: DC
  Filled 2019-03-09: qty 4

## 2019-03-09 MED ORDER — CHLORHEXIDINE GLUCONATE CLOTH 2 % EX PADS
6.0000 | MEDICATED_PAD | Freq: Every day | CUTANEOUS | Status: DC
  Administered 2019-03-09 – 2019-03-10 (×2): 6 via TOPICAL

## 2019-03-09 MED ORDER — POLYETHYLENE GLYCOL 3350 17 G PO PACK
34.0000 g | PACK | Freq: Three times a day (TID) | ORAL | Status: AC
  Administered 2019-03-09 – 2019-03-11 (×5): 34 g via ORAL
  Filled 2019-03-09 (×7): qty 2

## 2019-03-09 NOTE — Progress Notes (Addendum)
PROGRESS NOTE    SHIVAUN AVRIL  [id number] DOB: 1919-05-05 DOA: 03/06/2019 PCP: Loman Brooklyn, FNP    Assessment & Plan:   Active Problems:   Pancreatitis  [place] Darner  is a 85 y.o. Caucasian female, with medical history of hypothyroidism, vitamin B12 deficiency who was recently discharged from the hospital after she was treated for gross hematuria.  Patient was found to have bladder mass, staph aureus UTI.  Plan was to manage bladder cancer conservatively and follow-up with urology.  Foley catheter was placed. Today patient came back to hospital complaint of squeezing abdominal pain.  She had no nausea or vomiting.   # Acute pancreatitis 2/2 choledocholithiasis, improved --elevated LFTs, elevated bilirubin.  CT showed both intra and extrahepatic ductal dilatation.  [place] ab showed Cholelithiasis without cholecystitis, and mildly dilated common bile duct at 8 mm.  Lipase much reduced since presentation.  MRCP done and pt has at least 3 stones in her bile duct which measures 16 mm.   PLAN: --Pt refused ERCP today, and will not be done even if she decompensates. --Clear liquid for now and advance as tolerated --continue MIVF  # Staph aureus UTI, POA -patient was started on doxycycline at the time of last discharge.   PLAN: --continue 7-day course with IV doxy, last day 12/16  # Hypothyroidism --patient takes 112 mcg Synthroid at home.   --resume home Synthroid today  # Recent diagnosis of bladder cancer # Hematuria  -conservative management as per note from Dr. Dyann Kief from 03/04/2019.  Mild hematuria noted likely due to bladder cancer. --Continue Foley catheter drainage. --Palliative consult ordered today  # Recent right tibial spine avulsion fracture -right x-ray knee showed lateral tibial spine avulsion fracture, nonoperative management, weightbearing as tolerated.  # Digoxin use -patient was on digoxin with no clear reason for being on digoxin.  Will hold  digoxin at this time.  # Mild trop elevation due to demand ischemia --trop 44 and then 46 on presentation.  Not ACS.   DVT prophylaxis: SCD/Compression stockings Code Status: DNR  Family Communication: Updated daughter at bedside. Disposition Plan: Case manager and social worker to help with post-discharge care, pt will likely need to go to a facility, but currently does not have needs for hospice facility.  Consider long-term SNF.   Subjective and Interval History:  GI was planning on ERCP today, however, anesthesiology declined this case.  We were planning a transfer to [facility] for pt to receive ERCP, however, pt didn't want to be transferred and then declined to have ERCP done.  Patient's daughter was at bedside, and I had extensive discussion of risk and benefit with both.  Both daughter and I believe pt has decision making capacity, and will proceed according to pt's wishes.    Pt only complained of constipation and needed something to help her move her bowels.  No fever, dyspnea, chest pain, abdominal pain, N/V/D, dysuria, increased swelling.     Objective: Vitals:   03/08/19 2204 03/09/19 0635 03/09/19 1130 03/09/19 1954  BP: 140/81 126/71    Pulse: 82 76    Resp: 20 (!) 21    Temp: 99.2 F (37.3 C) 98.2 F (36.8 C)    TempSrc: Oral Oral    SpO2: 91% 92% 93% 91%  Weight:  54.2 kg    Height:        Intake/Output Summary (Last 24 hours) at 03/09/2019 2213 Last data filed at 03/09/2019 1624 Gross per 24 hour  Intake 923.45  ml  Output 1000 ml  Net -76.55 ml   Filed Weights   03/06/19 1615 03/07/19 1653 03/09/19 0635  Weight: 48.2 kg 53.1 kg 54.2 kg    Examination:   Constitutional: NAD, AAOx3 HEENT: conjunctivae and lids normal, EOMI, extremely hard of hearing CV: RRR no M,R,G. Distal pulses +2.  No cyanosis.   RESP: CTA B/L, normal respiratory effort  GI: +BS, ND, soft Extremities: No effusions, edema, or tenderness in BLE SKIN: warm, dry and intact Neuro:  II - XII grossly intact.  Sensation intact Psych: Normal mood and affect.  Appropriate judgement and reason   Data Reviewed: I have personally reviewed following labs and imaging studies  CBC: Recent Labs  Lab 03/03/19 0523 03/06/19 1649 03/07/19 0410 03/09/19 0552  WBC 11.6* 7.4 7.5 12.6*  NEUTROABS  --  6.2  --  10.4*  HGB 11.4* 10.5* 9.6* 10.7*  HCT 37.0 34.1* 31.1* 33.8*  MCV 87.5 86.8 86.9 85.6  PLT 228 159 140* [id number]   Basic Metabolic Panel: Recent Labs  Lab 03/06/19 1649 03/07/19 0410 03/09/19 0552  NA 134* 136 133*  K 3.5 3.5 3.7  CL 104 108 102  CO2 24 24 23   GLUCOSE 132* 108* 82  BUN 17 14 13   CREATININE 0.62 0.51 0.62  CALCIUM 9.1 9.0 8.8*   GFR: Estimated Creatinine Clearance: 34 mL/min (by C-G formula based on SCr of 0.62 mg/dL). Liver Function Tests: Recent Labs  Lab 03/06/19 1649 03/07/19 0410 03/08/19 0606 03/09/19 0552  AST 160* 152* 84* 43*  ALT 80* 87* 70* 50*  ALKPHOS 206* 182* 171* 176*  BILITOT 2.3* 3.1* 2.0* 1.8*  PROT 6.2* 5.2* 5.3* 5.3*  ALBUMIN 2.9* 2.3* 2.3* 2.2*   Recent Labs  Lab 03/06/19 1649 03/07/19 0410 03/09/19 0552  LIPASE 1,047* 316* 143*   No results for input(s): AMMONIA in the last 168 hours. Coagulation Profile: No results for input(s): INR, PROTIME in the last 168 hours. Cardiac Enzymes: No results for input(s): CKTOTAL, CKMB, CKMBINDEX, TROPONINI in the last 168 hours. BNP (last 3 results) No results for input(s): PROBNP in the last 8760 hours. HbA1C: No results for input(s): HGBA1C in the last 72 hours. CBG: Recent Labs  Lab 03/03/19 1603 03/03/19 2245 03/04/19 0756 03/04/19 1146  GLUCAP 145* 113* 80 89   Lipid Profile: No results for input(s): CHOL, HDL, LDLCALC, TRIG, CHOLHDL, LDLDIRECT in the last 72 hours. Thyroid Function Tests: No results for input(s): TSH, T4TOTAL, FREET4, T3FREE, THYROIDAB in the last 72 hours. Anemia Panel: No results for input(s): VITAMINB12, FOLATE, FERRITIN, TIBC,  IRON, RETICCTPCT in the last 72 hours. Sepsis Labs: No results for input(s): PROCALCITON, LATICACIDVEN in the last 168 hours.  Recent Results (from the past 240 hour(s))  Urine culture     Status: Abnormal   Collection Time: 03/01/19  1:09 PM   Specimen: Urine, Random  Result Value Ref Range Status   Specimen Description   Final    URINE, RANDOM Performed at [facility], [address], [place], [place] [address]    Special Requests   Final    NONE Performed at [facility], [address], [place], [place] [address]    Culture (A)  Final    10,000 COLONIES/mL METHICILLIN RESISTANT STAPHYLOCOCCUS AUREUS   Report Status 03/04/2019 FINAL  Final   Organism ID, Bacteria METHICILLIN RESISTANT STAPHYLOCOCCUS AUREUS (A)  Final      Susceptibility   Methicillin resistant staphylococcus aureus - MIC*    CIPROFLOXACIN >=8 RESISTANT  Resistant     GENTAMICIN <=0.5 SENSITIVE Sensitive     NITROFURANTOIN <=16 SENSITIVE Sensitive     OXACILLIN >=4 RESISTANT Resistant     TETRACYCLINE <=1 SENSITIVE Sensitive     VANCOMYCIN 1 SENSITIVE Sensitive     TRIMETH/SULFA <=10 SENSITIVE Sensitive     CLINDAMYCIN <=0.25 SENSITIVE Sensitive     RIFAMPIN <=0.5 SENSITIVE Sensitive     Inducible Clindamycin NEGATIVE Sensitive     * 10,000 COLONIES/mL METHICILLIN RESISTANT STAPHYLOCOCCUS AUREUS  SARS CORONAVIRUS 2 (TAT 6-24 HRS) Nasopharyngeal Nasopharyngeal Swab     Status: None   Collection Time: 03/01/19 10:41 PM   Specimen: Nasopharyngeal Swab  Result Value Ref Range Status   SARS Coronavirus 2 NEGATIVE NEGATIVE Final    Comment: (NOTE) SARS-CoV-2 target nucleic acids are NOT DETECTED. The SARS-CoV-2 RNA is generally detectable in upper and lower respiratory specimens during the acute phase of infection. Negative results do not preclude SARS-CoV-2 infection, do not rule out co-infections with other pathogens, and should not be used as the sole basis for treatment or other patient management  decisions. Negative results must be combined with clinical observations, patient history, and epidemiological information. The expected result is Negative. Fact Sheet for Patients: SugarRoll.be Fact Sheet for Healthcare Providers: https://www.woods-mathews.com/ This test is not yet approved or cleared by the [place] FDA and  has been authorized for detection and/or diagnosis of SARS-CoV-2 by FDA under an Emergency Use Authorization (EUA). This EUA will remain  in effect (meaning this test can be used) for the duration of the COVID-19 declaration under Section 56 4(b)(1) of the Act, 21 U.S.C. section 360bbb-3(b)(1), unless the authorization is terminated or revoked sooner. Performed at [facility], [place] [address] [place], [place] [address]   SARS CORONAVIRUS 2 (TAT 6-24 HRS) Nasopharyngeal Nasopharyngeal Swab     Status: None   Collection Time: 03/06/19  6:32 PM   Specimen: Nasopharyngeal Swab  Result Value Ref Range Status   SARS Coronavirus 2 NEGATIVE NEGATIVE Final    Comment: (NOTE) SARS-CoV-2 target nucleic acids are NOT DETECTED. The SARS-CoV-2 RNA is generally detectable in upper and lower respiratory specimens during the acute phase of infection. Negative results do not preclude SARS-CoV-2 infection, do not rule out co-infections with other pathogens, and should not be used as the sole basis for treatment or other patient management decisions. Negative results must be combined with clinical observations, patient history, and epidemiological information. The expected result is Negative. Fact Sheet for Patients: SugarRoll.be Fact Sheet for Healthcare Providers: https://www.woods-mathews.com/ This test is not yet approved or cleared by the [place] FDA and  has been authorized for detection and/or diagnosis of SARS-CoV-2 by FDA under an Emergency Use Authorization (EUA). This  EUA will remain  in effect (meaning this test can be used) for the duration of the COVID-19 declaration under Section 56 4(b)(1) of the Act, 21 U.S.C. section 360bbb-3(b)(1), unless the authorization is terminated or revoked sooner. Performed at [facility], [place] [address] [place], [place] [address]       Radiology Studies: MR 3D Recon At Scanner  Result Date: 03/08/2019 CLINICAL DATA:  Abdominal pain with jaundice for 2 months. History of gallstones. EXAM: MRI ABDOMEN WITHOUT AND WITH CONTRAST (INCLUDING MRCP) TECHNIQUE: Multiplanar multisequence MR imaging of the abdomen was performed both before and after the administration of intravenous contrast. Heavily T2-weighted images of the biliary and pancreatic ducts were obtained, and three-dimensional MRCP images were rendered by post processing. CONTRAST:  40mL GADAVIST GADOBUTROL 1 MMOL/ML IV SOLN COMPARISON:  Abdominopelvic CT 03/06/2019 abdominal ultrasound 03/07/2019 FINDINGS: Despite efforts by the technologist and patient, moderate motion artifact is present on today's exam and could not be eliminated. This reduces exam sensitivity and specificity. Lower chest: There are small bilateral pleural effusions with dependent atelectasis at both lung bases. The heart is moderately enlarged. No significant pericardial effusion. Hepatobiliary: There are probable small hepatic cysts. No enhancing liver lesions are identified. The gallbladder is filled with multiple small gallstones. There is mild intra and extrahepatic biliary dilatation with the common hepatic duct measuring up to 9 mm in diameter. There are multiple small stones within the common bile duct, best seen on series 8. Pancreas: As seen on recent CT, there is heterogeneous enlargement the proximal pancreatic tail with a complex peripherally enhancing fluid collection in the lesser sac, measuring up to 6.6 x 2.7 cm (image 65/17). No obvious pancreatic mass or pancreatic ductal  dilatation, although evaluation limited by motion. Spleen: Normal in size without focal abnormality. Adrenals/Urinary Tract: Both adrenal glands appear normal. No significant abnormalities of either kidney. There is no hydronephrosis. Stomach/Bowel: No evidence of bowel wall thickening, distention or surrounding inflammatory change. Vascular/Lymphatic: There are no enlarged abdominal lymph nodes. Aortic atherosclerosis without evidence of aneurysm. Other: Mild generalized soft tissue edema with a small amount of ascites. No other focal extraluminal fluid collections are seen. Musculoskeletal: There is a convex right thoracolumbar scoliosis associated with multiple compression deformities involving the T11, L1 and L3 vertebral bodies, similar to recent CT. Inferior endplate edema and enhancement are present on the left in the L4 and L5 vertebral bodies. There is corresponding sclerosis in the L5 vertebral body on recent CT, but no gross lytic lesion. IMPRESSION: 1. Cholelithiasis with choledocholithiasis and mild biliary dilatation. 2. Persistent heterogeneous enlargement of the proximal pancreatic tail with complex peripherally enhancing fluid collection in the lesser sac, similar to recent CT, and most consistent with pancreatitis. Patient has corresponding elevated serum lipase levels. These pancreatic findings are not well seen by this examination, in part due to motion artifact. CT follow-up recommended to exclude neoplasm. 3. Mild generalized soft tissue edema and small bilateral pleural effusions with bibasilar atelectasis. 4. Grossly stable thoracolumbar compression deformities. Indeterminate inferior endplate edema and enhancement in the L4 and L5 vertebral bodies. Electronically Signed   By: Richardean Sale M.D.   On: 03/08/2019 15:33   MR ABDOMEN MRCP W WO CONTAST  Result Date: 03/08/2019 CLINICAL DATA:  Abdominal pain with jaundice for 2 months. History of gallstones. EXAM: MRI ABDOMEN WITHOUT AND  WITH CONTRAST (INCLUDING MRCP) TECHNIQUE: Multiplanar multisequence MR imaging of the abdomen was performed both before and after the administration of intravenous contrast. Heavily T2-weighted images of the biliary and pancreatic ducts were obtained, and three-dimensional MRCP images were rendered by post processing. CONTRAST:  69mL GADAVIST GADOBUTROL 1 MMOL/ML IV SOLN COMPARISON:  Abdominopelvic CT 03/06/2019 abdominal ultrasound 03/07/2019 FINDINGS: Despite efforts by the technologist and patient, moderate motion artifact is present on today's exam and could not be eliminated. This reduces exam sensitivity and specificity. Lower chest: There are small bilateral pleural effusions with dependent atelectasis at both lung bases. The heart is moderately enlarged. No significant pericardial effusion. Hepatobiliary: There are probable small hepatic cysts. No enhancing liver lesions are identified. The gallbladder is filled with multiple small gallstones. There is mild intra and extrahepatic biliary dilatation with the common hepatic duct measuring up to 9 mm in diameter. There are multiple small stones  within the common bile duct, best seen on series 8. Pancreas: As seen on recent CT, there is heterogeneous enlargement the proximal pancreatic tail with a complex peripherally enhancing fluid collection in the lesser sac, measuring up to 6.6 x 2.7 cm (image 65/17). No obvious pancreatic mass or pancreatic ductal dilatation, although evaluation limited by motion. Spleen: Normal in size without focal abnormality. Adrenals/Urinary Tract: Both adrenal glands appear normal. No significant abnormalities of either kidney. There is no hydronephrosis. Stomach/Bowel: No evidence of bowel wall thickening, distention or surrounding inflammatory change. Vascular/Lymphatic: There are no enlarged abdominal lymph nodes. Aortic atherosclerosis without evidence of aneurysm. Other: Mild generalized soft tissue edema with a small amount of  ascites. No other focal extraluminal fluid collections are seen. Musculoskeletal: There is a convex right thoracolumbar scoliosis associated with multiple compression deformities involving the T11, L1 and L3 vertebral bodies, similar to recent CT. Inferior endplate edema and enhancement are present on the left in the L4 and L5 vertebral bodies. There is corresponding sclerosis in the L5 vertebral body on recent CT, but no gross lytic lesion. IMPRESSION: 1. Cholelithiasis with choledocholithiasis and mild biliary dilatation. 2. Persistent heterogeneous enlargement of the proximal pancreatic tail with complex peripherally enhancing fluid collection in the lesser sac, similar to recent CT, and most consistent with pancreatitis. Patient has corresponding elevated serum lipase levels. These pancreatic findings are not well seen by this examination, in part due to motion artifact. CT follow-up recommended to exclude neoplasm. 3. Mild generalized soft tissue edema and small bilateral pleural effusions with bibasilar atelectasis. 4. Grossly stable thoracolumbar compression deformities. Indeterminate inferior endplate edema and enhancement in the L4 and L5 vertebral bodies. Electronically Signed   By: Richardean Sale M.D.   On: 03/08/2019 15:33     Scheduled Meds: . Chlorhexidine Gluconate Cloth  6 each Topical Daily  . levothyroxine  55 mcg Intravenous Daily  . pantoprazole (PROTONIX) IV  40 mg Intravenous Q24H  . polyethylene glycol  34 g Oral TID   Continuous Infusions: . sodium chloride    . ampicillin-sulbactam (UNASYN) 1.5 g IVPB (Mini-Bag Plus)    . doxycycline (VIBRAMYCIN) IV 100 mg (03/09/19 1125)  . lactated ringers 75 mL/hr at 03/09/19 0705     LOS: 3 days     Enzo Bi, MD [facility] If 7PM-7AM, please contact night-coverage 03/09/2019, 10:13 PM

## 2019-03-09 NOTE — Progress Notes (Signed)
  Subjective:  Patient complains of soreness across upper abdomen.  She denies chest pain or shortness of breath.  Objective: Blood pressure 126/71, pulse 76, temperature 98.2 F (36.8 C), temperature source Oral, resp. rate (!) 21, height _0  (1.6 m), weight 54.2 kg, SpO2 92 %. Patient is alert and in no acute distress. She is very thin. She has hearing impairment and she is blind in her right eye. Dentition in poor condition. Cardiac exam with regular rhythm normal S1 and S2.  Grade 2/6 systolic murmur best heard at aortic area. Auscultation lungs reveal vesicular breath sounds bilaterally. Abdomen is flat.  Bowel sounds are normal.  She has mild tenderness in right lower quadrant and gallbladder may be palpable. No hepatosplenomegaly. No peripheral edema or clubbing noted. Foley's catheter in place.  Labs/studies Results:  CBC Latest Ref Rng & Units 03/09/2019 03/07/2019 03/06/2019  WBC 4.0 - 10.5 K/uL 12.6(H) 7.5 7.4  Hemoglobin 12.0 - 15.0 g/dL 10.7(L) 9.6(L) 10.5(L)  Hematocrit 36.0 - 46.0 % 33.8(L) 31.1(L) 34.1(L)  Platelets 150 - 400 K/uL 180 140(L) 159    CMP Latest Ref Rng & Units 03/09/2019 03/08/2019 03/07/2019  Glucose 70 - 99 mg/dL 82 - 108(H)  BUN 8 - 23 mg/dL 13 - 14  Creatinine 0.44 - 1.00 mg/dL 0.62 - 0.51  Sodium 135 - 145 mmol/L 133(L) - 136  Potassium 3.5 - 5.1 mmol/L 3.7 - 3.5  Chloride 98 - 111 mmol/L 102 - 108  CO2 22 - 32 mmol/L 23 - 24  Calcium 8.9 - 10.3 mg/dL 8.8(L) - 9.0  Total Protein 6.5 - 8.1 g/dL 5.3(L) 5.3(L) 5.2(L)  Total Bilirubin 0.3 - 1.2 mg/dL 1.8(H) 2.0(H) 3.1(H)  Alkaline Phos 38 - 126 U/L 176(H) 171(H) 182(H)  AST 15 - 41 U/L 43(H) 84(H) 152(H)  ALT 0 - 44 U/L 50(H) 70(H) 87(H)    Hepatic Function Latest Ref Rng & Units 03/09/2019 03/08/2019 03/07/2019  Total Protein 6.5 - 8.1 g/dL 5.3(L) 5.3(L) 5.2(L)  Albumin 3.5 - 5.0 g/dL 2.2(L) 2.3(L) 2.3(L)  AST 15 - 41 U/L 43(H) 84(H) 152(H)  ALT 0 - 44 U/L 50(H) 70(H) 87(H)  Alk  Phosphatase 38 - 126 U/L 176(H) 171(H) 182(H)  Total Bilirubin 0.3 - 1.2 mg/dL 1.8(H) 2.0(H) 3.1(H)  Bilirubin, Direct 0.0 - 0.2 mg/dL 0.7(H) 1.0(H) -     Assessment:  #1.  Biliary pancreatitis.  Patient's bilirubin transaminases and alkaline phosphatase remain mildly elevated.  She has at least 3 stones in her bile duct which measures 16 mm.  Bile duct was only 10 mm 8 days ago when she was in with gross hematuria.  Patient will benefit from therapeutic ERCP.  Patient is on doxycycline primarily for UTI.  #2.  Bladder mass felt to be malignant neoplasm.  Patient has Foley's catheter in place.  Given her age no plans for treatment  #3.  Mildly elevated troponin level felt to be due to demand ischemia.  Discussed with Dr. Billie Ruddy.  #4.  Mitral regurgitation.  Patient has no chest or respiratory symptoms.  #5.  Anemia most likely due to chronic disease and hematuria.  Recommendations  ERCP with sphincterotomy and stone extraction later today if patient cleared by anesthesia. Will talk with her daughter again when she arrives at the hospital.

## 2019-03-09 NOTE — Progress Notes (Signed)
ERCP canceled. Dr. Hilaria Ota of anesthesia feels patient is too high risk and should be transferred to tertiary center. Dr. Billie Ruddy informed.

## 2019-03-09 NOTE — Plan of Care (Signed)

## 2019-03-09 NOTE — Progress Notes (Signed)
Spoke with daughter Tomie China. She gave informed consent for ERCP on 03/09/2019. Second nurse Charma Igo to verify.

## 2019-03-09 NOTE — Progress Notes (Signed)
Attempted to call patient's daughter, Marlowe Kays, to obtain consent x2. No answer.

## 2019-03-10 DIAGNOSIS — K8020 Calculus of gallbladder without cholecystitis without obstruction: Secondary | ICD-10-CM

## 2019-03-10 DIAGNOSIS — R7401 Elevation of levels of liver transaminase levels: Secondary | ICD-10-CM

## 2019-03-10 DIAGNOSIS — K859 Acute pancreatitis without necrosis or infection, unspecified: Secondary | ICD-10-CM

## 2019-03-10 DIAGNOSIS — K8050 Calculus of bile duct without cholangitis or cholecystitis without obstruction: Secondary | ICD-10-CM

## 2019-03-10 LAB — CBC
HCT: 35.2 % — ABNORMAL LOW (ref 36.0–46.0)
Hemoglobin: 11.2 g/dL — ABNORMAL LOW (ref 12.0–15.0)
MCH: 26.9 pg (ref 26.0–34.0)
MCHC: 31.8 g/dL (ref 30.0–36.0)
MCV: 84.6 fL (ref 80.0–100.0)
Platelets: 180 10*3/uL (ref 150–400)
RBC: 4.16 MIL/uL (ref 3.87–5.11)
RDW: 15.5 % (ref 11.5–15.5)
WBC: 15.5 10*3/uL — ABNORMAL HIGH (ref 4.0–10.5)
nRBC: 0 % (ref 0.0–0.2)

## 2019-03-10 LAB — HEPATIC FUNCTION PANEL
ALT: 44 U/L (ref 0–44)
AST: 41 U/L (ref 15–41)
Albumin: 2.3 g/dL — ABNORMAL LOW (ref 3.5–5.0)
Alkaline Phosphatase: 198 U/L — ABNORMAL HIGH (ref 38–126)
Bilirubin, Direct: 0.8 mg/dL — ABNORMAL HIGH (ref 0.0–0.2)
Indirect Bilirubin: 1.1 mg/dL — ABNORMAL HIGH (ref 0.3–0.9)
Total Bilirubin: 1.9 mg/dL — ABNORMAL HIGH (ref 0.3–1.2)
Total Protein: 5.6 g/dL — ABNORMAL LOW (ref 6.5–8.1)

## 2019-03-10 LAB — BASIC METABOLIC PANEL
Anion gap: 8 (ref 5–15)
BUN: 12 mg/dL (ref 8–23)
CO2: 21 mmol/L — ABNORMAL LOW (ref 22–32)
Calcium: 8.7 mg/dL — ABNORMAL LOW (ref 8.9–10.3)
Chloride: 102 mmol/L (ref 98–111)
Creatinine, Ser: 0.61 mg/dL (ref 0.44–1.00)
GFR calc Af Amer: >60 mL/min (ref >60)
GFR calc non Af Amer: >60 mL/min (ref >60)
Glucose, Bld: 113 mg/dL — ABNORMAL HIGH (ref 70–99)
Potassium: 3.5 mmol/L (ref 3.5–5.1)
Sodium: 131 mmol/L — ABNORMAL LOW (ref 135–145)

## 2019-03-10 LAB — MAGNESIUM: Magnesium: 1.5 mg/dL — ABNORMAL LOW (ref 1.7–2.4)

## 2019-03-10 LAB — PHOSPHORUS: Phosphorus: 2 mg/dL — ABNORMAL LOW (ref 2.5–4.6)

## 2019-03-10 MED ORDER — LEVOTHYROXINE SODIUM 112 MCG PO TABS
112.0000 ug | ORAL_TABLET | Freq: Every day | ORAL | Status: DC
  Administered 2019-03-10 – 2019-03-11 (×2): 112 ug via ORAL
  Filled 2019-03-10 (×2): qty 1

## 2019-03-10 MED ORDER — MAGNESIUM SULFATE 4 GM/100ML IV SOLN
4.0000 g | Freq: Once | INTRAVENOUS | Status: AC
  Administered 2019-03-10: 4 g via INTRAVENOUS
  Filled 2019-03-10: qty 100

## 2019-03-10 MED ORDER — K PHOS MONO-SOD PHOS DI & MONO 155-852-130 MG PO TABS
500.0000 mg | ORAL_TABLET | Freq: Three times a day (TID) | ORAL | Status: DC
  Administered 2019-03-10 – 2019-03-11 (×4): 500 mg via ORAL
  Filled 2019-03-10 (×4): qty 2

## 2019-03-10 NOTE — Progress Notes (Signed)
Palliative-  Thank you for this consult. Patient's chart reviewed.  Called Tomie China- patient's daughter. Introduced Palliative Medicine. Palliative medicine is specialized medical care for people living with serious illness. It focuses on providing relief from the symptoms and stress of a serious illness. The goal is to improve quality of life for both the patient and the family.  Meeting set for tomorrow 12/17 at [place], AGNP-C Palliative Medicine  Please call Palliative Medicine team phone with any questions [phone]. For individual providers please see AMION.

## 2019-03-10 NOTE — Evaluation (Signed)
Physical Therapy Evaluation Patient Details Name: Brandy Fitzpatrick MRN: [id number] DOB: 16-Feb-1920 Today's Date: 03/10/2019   History of Present Illness  Brandy Fitzpatrick  is a 85 y.o. female, with medical history of hypothyroidism, vitamin B12 deficiency who was recently discharged from the hospital after she was treated for gross hematuria.  Patient was found to have bladder mass, staph aureus UTI.  Plan was to manage bladder cancer conservatively and follow-up with urology.  Foley catheter was placed.Today patient came back to hospital complaint of squeezing abdominal pain.  She had no nausea or vomiting.    Clinical Impression  Patient limited for functional mobility as stated below secondary to BLE weakness, fatigue and poor standing balance. She requires mod assist with bed mobility, transfers, and ambulation but is limited secondary to impaired activity tolerance. Patient is severely hard of hearing and has vision problems as well. Patient fatigues quickly during today's session. She requires some verbal cueing and demonstration for hand placement and sequencing using RW. Patient will benefit from continued physical therapy in hospital and recommended venue below to increase strength, balance, endurance for safe ADLs and gait.     Follow Up Recommendations SNF    Equipment Recommendations  None recommended by PT    Recommendations for Other Services       Precautions / Restrictions Precautions Precautions: Fall Restrictions Weight Bearing Restrictions: No      Mobility  Bed Mobility Overal bed mobility: Needs Assistance Bed Mobility: Supine to Sit     Supine to sit: Mod assist     General bed mobility comments: mod assist to transition to seated EOB with demonstration for sequencing  Transfers Overall transfer level: Needs assistance Equipment used: Rolling walker (2 wheeled) Transfers: Sit to/from [facility] Sit to Stand: Mod assist Stand pivot  transfers: Mod assist       General transfer comment: cueing for hand placement, sequencing with RW, slow, labored  Ambulation/Gait Ambulation/Gait assistance: Mod assist Gait Distance (Feet): 2 Feet Assistive device: Rolling walker (2 wheeled) Gait Pattern/deviations: Step-through pattern;Decreased step length - right;Decreased step length - left;Shuffle;Decreased stride length Gait velocity: decreased   General Gait Details: several small shuffled steps at bedside to chair  Stairs            Wheelchair Mobility    Modified Rankin (Stroke Patients Only)       Balance Overall balance assessment: Needs assistance Sitting-balance support: No upper extremity supported;Feet supported Sitting balance-Leahy Scale: Fair Sitting balance - Comments: seated EOB   Standing balance support: Bilateral upper extremity supported Standing balance-Leahy Scale: Poor Standing balance comment: Using RW                             Pertinent Vitals/Pain Pain Assessment: Faces Faces Pain Scale: Hurts a little bit Pain Location: stomach Pain Intervention(s): Limited activity within patient's tolerance;Monitored during session    [place] expects to be discharged to:: Private residence Living Arrangements: Alone Available Help at Discharge: Family;Available PRN/intermittently           Home Equipment: Walker - 2 wheels;Cane - single point;Bedside commode Additional Comments: Patient severely hard of hearing, difficult to obtain home set up    Prior Function Level of Independence: Independent with assistive device(s);Needs assistance   Gait / Transfers Assistance Needed: limited household ambulator with RW  ADL's / Homemaking Assistance Needed: Patient states she was able to perform some with assist of family  Hand Dominance        Extremity/Trunk Assessment   Upper Extremity Assessment Upper Extremity Assessment: Generalized  weakness    Lower Extremity Assessment Lower Extremity Assessment: Generalized weakness    Cervical / Trunk Assessment Cervical / Trunk Assessment: Kyphotic  Communication   Communication: HOH  Cognition Arousal/Alertness: Awake/alert Behavior During Therapy: WFL for tasks assessed/performed Overall Cognitive Status: Within Functional Limits for tasks assessed                                 General Comments: Patient severly HOH but cognition seems [facility]      General Comments      Exercises     Assessment/Plan    PT Assessment Patient needs continued PT services  PT Problem List Decreased strength;Decreased activity tolerance;Decreased balance;Decreased mobility;Decreased safety awareness;Pain       PT Treatment Interventions DME instruction;Gait training;Functional mobility training;Therapeutic activities;Patient/family education;Balance training;Therapeutic exercise;Neuromuscular re-education;Manual techniques;Modalities    PT Goals (Current goals can be found in the Care Plan section)  Acute Rehab PT Goals Patient Stated Goal: get stronger PT Goal Formulation: With patient Time For Goal Achievement: 03/24/19 Potential to Achieve Goals: Fair    Frequency Min 3X/week   Barriers to discharge        Co-evaluation               AM-PAC PT "6 Clicks" Mobility  Outcome Measure Help needed turning from your back to your side while in a flat bed without using bedrails?: None Help needed moving from lying on your back to sitting on the side of a flat bed without using bedrails?: A Lot Help needed moving to and from a bed to a chair (including a wheelchair)?: A Lot Help needed standing up from a chair using your arms (e.g., wheelchair or bedside chair)?: A Lot Help needed to walk in hospital room?: A Lot Help needed climbing 3-5 steps with a railing? : Total 6 Click Score: 13    End of Session Equipment Utilized During Treatment: Gait  belt Activity Tolerance: Patient limited by fatigue;Patient tolerated treatment well Patient left: in chair;with call bell/phone within reach;with bed alarm set Nurse Communication: Mobility status PT Visit Diagnosis: Unsteadiness on feet (R26.81);Other abnormalities of gait and mobility (R26.89);Muscle weakness (generalized) (M62.81)    Time: [id number] PT Time Calculation (min) (ACUTE ONLY): 41 min   Charges:   PT Evaluation $PT Eval Low Complexity: 1 Low PT Treatments $Therapeutic Activity: 38-52 mins        11:51 AM, 03/10/19 Mearl Latin PT, DPT Physical Therapist at [facility]

## 2019-03-10 NOTE — Plan of Care (Signed)
  Problem: Acute Rehab PT Goals(only PT should resolve) Goal: Pt Will Go Supine/Side To Sit Outcome: Progressing Flowsheets (Taken 03/10/2019 1153) Pt will go Supine/Side to Sit: with minimal assist Goal: Patient Will Transfer Sit To/From Stand Outcome: Progressing Flowsheets (Taken 03/10/2019 1153) Patient will transfer sit to/from stand: with minimal assist Goal: Pt Will Transfer Bed To Chair/Chair To Bed Outcome: Progressing Flowsheets (Taken 03/10/2019 1153) Pt will Transfer Bed to Chair/Chair to Bed: with min assist Goal: Pt Will Ambulate Outcome: Progressing Flowsheets (Taken 03/10/2019 1153) Pt will Ambulate:  25 feet  with minimal assist  with least restrictive assistive device   11:54 AM, 03/10/19 Mearl Latin PT, DPT Physical Therapist at [facility]

## 2019-03-10 NOTE — Care Management Important Message (Signed)
Important Message  Patient Details  Name: Brandy Fitzpatrick MRN: [id number] Date of Birth: 08-May-1919   Medicare Important Message Given:  Yes(letter placed on chart, Stanton Kidney, RN will deliver to patient)     Tommy Medal 03/10/2019, 3:27 PM

## 2019-03-10 NOTE — Progress Notes (Signed)
Subjective:  Patient hard of hearing. She states she has some increased upper abdominal pain "from her pancreas" since PT got her up into the recliner. No N/V. States her last BM was four days ago.   Objective: Vital signs in last 24 hours: Temp:  [97.4 F (36.3 C)-99.9 F (37.7 C)] 97.4 F (36.3 C) (12/16 0500) Pulse Rate:  [79-102] 79 (12/16 0500) Resp:  [20] 20 (12/16 0500) BP: (91-108)/(70-74) 108/70 (12/16 0500) SpO2:  [90 %-91 %] 91 % (12/16 0500) Last BM Date: (unsure) General:   Alert,  Elderly, frail female in NAD Head:  Normocephalic and atraumatic. Eyes:  Sclera clear, no icterus.  Abdomen:  Soft, nontender and nondistended.Normal bowel sounds. Extremities:  Without clubbing, deformity or edema. Neurologic:  Alert and  oriented x 3. Skin:  Intact without significant lesions or rashes. Psych:  Alert and cooperative. Normal mood and affect.  Intake/Output from previous day: 12/15 0701 - 12/16 0700 In: 0  Out: 1000 [Urine:1000] Intake/Output this shift: Total I/O In: 240 [P.O.:240] Out: -   Lab Results: CBC Recent Labs    03/09/19 0552 03/10/19 0106  WBC 12.6* 15.5*  HGB 10.7* 11.2*  HCT 33.8* 35.2*  MCV 85.6 84.6  PLT 180 180   BMET Recent Labs    03/09/19 0552 03/10/19 0106  NA 133* 131*  K 3.7 3.5  CL 102 102  CO2 23 21*  GLUCOSE 82 113*  BUN 13 12  CREATININE 0.62 0.61  CALCIUM 8.8* 8.7*   LFTs Recent Labs    03/08/19 0606 03/09/19 0552 03/10/19 0106  BILITOT 2.0* 1.8* 1.9*  BILIDIR 1.0* 0.7* 0.8*  IBILI 1.0* 1.1* 1.1*  ALKPHOS 171* 176* 198*  AST 84* 43* 41  ALT 70* 50* 44  PROT 5.3* 5.3* 5.6*  ALBUMIN 2.3* 2.2* 2.3*   Recent Labs    03/09/19 0552  LIPASE 143*   PT/INR No results for input(s): LABPROT, INR in the last 72 hours.    Imaging Studies: CT Chest W Contrast  Result Date: 03/06/2019 CLINICAL DATA:  Abnormal chest x-ray with findings suspicious for pulmonary nodules EXAM: CT CHEST, ABDOMEN, AND PELVIS WITH  CONTRAST TECHNIQUE: Multidetector CT imaging of the chest, abdomen and pelvis was performed following the standard protocol during bolus administration of intravenous contrast. CONTRAST:  172mL OMNIPAQUE IOHEXOL 300 MG/ML  SOLN COMPARISON:  Chest x-ray from earlier in the same day, CT from 08/06/2005. FINDINGS: CT CHEST FINDINGS Cardiovascular: Thoracic aorta demonstrates a normal branching pattern. No aneurysmal dilatation or dissection is seen. Atherosclerotic calcifications are noted of a mild degree. Heart is mildly enlarged. Coronary calcifications are seen. The pulmonary artery shows no large central pulmonary embolus. Mediastinum/Nodes: Thoracic inlet is within normal limits. No hilar or mediastinal adenopathy is noted. The esophagus is within normal limits. Lungs/Pleura: The lungs are well aerated bilaterally. There are patchy areas of atelectasis/early infiltrate with small effusions in the lower lobes bilaterally. Multiple scattered pulmonary nodules are seen in both lungs. The largest of these on the left measures almost 10 mm in greatest dimension. The largest of these on the right measures almost 2 cm with some enhancement identified. These likely represent metastatic disease. No pneumothorax is seen. Musculoskeletal: Degenerative changes of the thoracic spine are noted. Increased kyphosis is seen. No definitive acute rib abnormality is noted. Chronic appearing compression deformities are noted at T4, T5 and T11. CT ABDOMEN PELVIS FINDINGS Hepatobiliary: Liver is well visualized without focal mass. Slight increase in intrahepatic biliary ductal dilatation is noted.  The gallbladder is well distended with evidence of gallstones and gallbladder sludge stable from the prior exam. No pericholecystic fluid is noted. Common bile duct is dilated and slightly increased when compared with the prior exam likely related to the underlying pancreatitis. Pancreas: Pancreas demonstrates diffuse mottled enhancement with  evidence of a fluid collection measuring 5.6 x 2.1 cm in greatest transverse and AP dimensions respectively along the anterior aspect of the pancreatic body. This was not present on the prior exam and is consistent with acute pancreatitis. Diffuse edema throughout the majority of the pancreas body is noted consistent with the pancreatitis. This corresponds with the significantly elevated lipase on laboratory values. Spleen: Normal in size without focal abnormality. Adrenals/Urinary Tract: Adrenal glands are within normal limits. Kidneys demonstrate a normal enhancement pattern bilaterally. Normal excretion of contrast is seen. The bladder is decompressed by Foley catheter. Previously seen left lateral bladder mass is noted but incompletely evaluated due to the decompression of the bladder. Stomach/Bowel: No obstructive or inflammatory changes of the colon are noted. The appendix is well visualized best seen on the coronal imaging and within normal limits. Small bowel is within normal limits. No gastric abnormality is seen. Vascular/Lymphatic: Aortic atherosclerosis. No enlarged abdominal or pelvic lymph nodes. Reproductive: Uterus and bilateral adnexa are unremarkable. Other: No significant free pelvic fluid is noted. Mild perihepatic fluid is noted likely related to the underlying pancreatitis. Musculoskeletal: L1 and L3 compression deformities are again seen. Degenerative changes are noted. IMPRESSION: CT of the chest: New bibasilar atelectatic changes with small effusions. Multiple bilateral pulmonary nodules the largest of which lies in the right lower lobe posteriorly as described. Given the patient's known bladder mass disease are likely metastatic in nature. Stable compression deformities are noted. CT of the abdomen and pelvis: New changes of acute pancreatitis within the body of the pancreas with a fluid collection noted along the anterior aspect of the pancreas as well as diffuse pancreatic edema. Some  mild free fluid is noted related to these changes as well. Stable dilatation of the gallbladder with gallbladder sludge and gallstones. Slight increase in the degree of intrahepatic and extrahepatic biliary ductal dilatation. This is likely related to the underlying pancreatitis. Bladder is decompressed although the known left bladder mass is again noted. Electronically Signed   By: Inez Catalina M.D.   On: 03/06/2019 21:06   Abdominal ultrasound  Result Date: 03/07/2019 CLINICAL DATA:  Pancreatitis. EXAM: ABDOMEN ULTRASOUND COMPLETE COMPARISON:  March 06, 2019. FINDINGS: Gallbladder: Cholelithiasis is noted without gallbladder wall thickening or pericholecystic fluid. No sonographic Murphy's sign is noted. Common bile duct: Diameter: 8 mm which is mildly dilated. Liver: No focal lesion identified. Within normal limits in parenchymal echogenicity. Portal vein is patent on color Doppler imaging with normal direction of blood flow towards the liver. IVC: No abnormality visualized. Pancreas: Pancreatic head appears normal. Body and tail not well visualized due to overlying bowel gas. Spleen: Size and appearance within normal limits. Right Kidney: Length: 11.5 cm. Echogenicity within normal limits. No mass or hydronephrosis visualized. Left Kidney: Length: 10.3 cm. Echogenicity within normal limits. No mass or hydronephrosis visualized. Abdominal aorta: No aneurysm visualized. Other findings: None. IMPRESSION: Cholelithiasis without cholecystitis. Pancreatic head appears normal. Body and tail not well visualized due to overlying bowel gas. Common bile duct is mildly dilated at 8 mm. Correlation with liver function tests is recommended to rule out distal common bile duct obstruction. Electronically Signed   By: Marijo Conception M.D.   On: 03/07/2019  11:46   CT ABDOMEN PELVIS W CONTRAST  Result Date: 03/06/2019 CLINICAL DATA:  Abnormal chest x-ray with findings suspicious for pulmonary nodules EXAM: CT CHEST,  ABDOMEN, AND PELVIS WITH CONTRAST TECHNIQUE: Multidetector CT imaging of the chest, abdomen and pelvis was performed following the standard protocol during bolus administration of intravenous contrast. CONTRAST:  135mL OMNIPAQUE IOHEXOL 300 MG/ML  SOLN COMPARISON:  Chest x-ray from earlier in the same day, CT from 08/06/2005. FINDINGS: CT CHEST FINDINGS Cardiovascular: Thoracic aorta demonstrates a normal branching pattern. No aneurysmal dilatation or dissection is seen. Atherosclerotic calcifications are noted of a mild degree. Heart is mildly enlarged. Coronary calcifications are seen. The pulmonary artery shows no large central pulmonary embolus. Mediastinum/Nodes: Thoracic inlet is within normal limits. No hilar or mediastinal adenopathy is noted. The esophagus is within normal limits. Lungs/Pleura: The lungs are well aerated bilaterally. There are patchy areas of atelectasis/early infiltrate with small effusions in the lower lobes bilaterally. Multiple scattered pulmonary nodules are seen in both lungs. The largest of these on the left measures almost 10 mm in greatest dimension. The largest of these on the right measures almost 2 cm with some enhancement identified. These likely represent metastatic disease. No pneumothorax is seen. Musculoskeletal: Degenerative changes of the thoracic spine are noted. Increased kyphosis is seen. No definitive acute rib abnormality is noted. Chronic appearing compression deformities are noted at T4, T5 and T11. CT ABDOMEN PELVIS FINDINGS Hepatobiliary: Liver is well visualized without focal mass. Slight increase in intrahepatic biliary ductal dilatation is noted. The gallbladder is well distended with evidence of gallstones and gallbladder sludge stable from the prior exam. No pericholecystic fluid is noted. Common bile duct is dilated and slightly increased when compared with the prior exam likely related to the underlying pancreatitis. Pancreas: Pancreas demonstrates diffuse  mottled enhancement with evidence of a fluid collection measuring 5.6 x 2.1 cm in greatest transverse and AP dimensions respectively along the anterior aspect of the pancreatic body. This was not present on the prior exam and is consistent with acute pancreatitis. Diffuse edema throughout the majority of the pancreas body is noted consistent with the pancreatitis. This corresponds with the significantly elevated lipase on laboratory values. Spleen: Normal in size without focal abnormality. Adrenals/Urinary Tract: Adrenal glands are within normal limits. Kidneys demonstrate a normal enhancement pattern bilaterally. Normal excretion of contrast is seen. The bladder is decompressed by Foley catheter. Previously seen left lateral bladder mass is noted but incompletely evaluated due to the decompression of the bladder. Stomach/Bowel: No obstructive or inflammatory changes of the colon are noted. The appendix is well visualized best seen on the coronal imaging and within normal limits. Small bowel is within normal limits. No gastric abnormality is seen. Vascular/Lymphatic: Aortic atherosclerosis. No enlarged abdominal or pelvic lymph nodes. Reproductive: Uterus and bilateral adnexa are unremarkable. Other: No significant free pelvic fluid is noted. Mild perihepatic fluid is noted likely related to the underlying pancreatitis. Musculoskeletal: L1 and L3 compression deformities are again seen. Degenerative changes are noted. IMPRESSION: CT of the chest: New bibasilar atelectatic changes with small effusions. Multiple bilateral pulmonary nodules the largest of which lies in the right lower lobe posteriorly as described. Given the patient's known bladder mass disease are likely metastatic in nature. Stable compression deformities are noted. CT of the abdomen and pelvis: New changes of acute pancreatitis within the body of the pancreas with a fluid collection noted along the anterior aspect of the pancreas as well as diffuse  pancreatic edema. Some mild free  fluid is noted related to these changes as well. Stable dilatation of the gallbladder with gallbladder sludge and gallstones. Slight increase in the degree of intrahepatic and extrahepatic biliary ductal dilatation. This is likely related to the underlying pancreatitis. Bladder is decompressed although the known left bladder mass is again noted. Electronically Signed   By: Inez Catalina M.D.   On: 03/06/2019 21:06   CT ABDOMEN PELVIS W CONTRAST  Result Date: 03/01/2019 CLINICAL DATA:  Hematuria, abdominal distension EXAM: CT ABDOMEN AND PELVIS WITH CONTRAST TECHNIQUE: Multidetector CT imaging of the abdomen and pelvis was performed using the standard protocol following bolus administration of intravenous contrast. CONTRAST:  5mL OMNIPAQUE IOHEXOL 300 MG/ML  SOLN COMPARISON:  None. FINDINGS: Motion degraded images. Lower chest: Emphysematous changes with bronchiectasis in the right middle lobe, lingula, and bilateral lower lobes. 8 mm medial right lower lobe nodule (series 4/image 4), grossly unchanged from 2007, benign. Hepatobiliary: Liver is within normal limits. Multiple gallstones, without associated inflammatory changes. No intrahepatic or extrahepatic ductal dilatation. Pancreas: Within normal limits. Spleen: Within normal limits. Adrenals/Urinary Tract: Adrenal glands are within normal limits. Kidneys are grossly unremarkable.  No hydronephrosis. Large left inferolateral bladder mass measuring at least 6.4 x 3.9 cm (series 2/image 70), suspicious for primary bladder neoplasm. Additional 1.2 x 2.8 cm lesion along the right anterior bladder (series 2/image 69), also worrisome for tumor. Stomach/Bowel: Stomach is within normal limits. No evidence of bowel obstruction. Appendix is not discretely visualized. Vascular/Lymphatic: No evidence of abdominal aortic aneurysm. Atherosclerotic calcifications of the abdominal aorta and branch vessels. No suspicious abdominopelvic  lymphadenopathy. Reproductive: Uterus is within normal limits. No adnexal masses, noting streak artifact obscures the left pelvis. Other: No abdominopelvic ascites. Musculoskeletal: Moderate to severe compression fracture deformities at T11 and L1 (sagittal image 41), unchanged from 2015. Mild superior endplate compression fracture deformity at L3, also unchanged. No retropulsion. Left hip arthroplasty, without evidence of complication. IMPRESSION: Motion degraded images. Multifocal bladder masses, suspicious for primary bladder neoplasm. Urology consultation is suggested for cystoscopy if tissue diagnosis is desired. No findings specific for synchronous or metastatic disease. 8 mm medial right lower lobe nodule, grossly unchanged from 2007, benign. Cholelithiasis, without associated inflammatory changes. Electronically Signed   By: Julian Hy M.D.   On: 03/01/2019 17:18   MR 3D Recon At Scanner  Result Date: 03/08/2019 CLINICAL DATA:  Abdominal pain with jaundice for 2 months. History of gallstones. EXAM: MRI ABDOMEN WITHOUT AND WITH CONTRAST (INCLUDING MRCP) TECHNIQUE: Multiplanar multisequence MR imaging of the abdomen was performed both before and after the administration of intravenous contrast. Heavily T2-weighted images of the biliary and pancreatic ducts were obtained, and three-dimensional MRCP images were rendered by post processing. CONTRAST:  48mL GADAVIST GADOBUTROL 1 MMOL/ML IV SOLN COMPARISON:  Abdominopelvic CT 03/06/2019 abdominal ultrasound 03/07/2019 FINDINGS: Despite efforts by the technologist and patient, moderate motion artifact is present on today's exam and could not be eliminated. This reduces exam sensitivity and specificity. Lower chest: There are small bilateral pleural effusions with dependent atelectasis at both lung bases. The heart is moderately enlarged. No significant pericardial effusion. Hepatobiliary: There are probable small hepatic cysts. No enhancing liver lesions  are identified. The gallbladder is filled with multiple small gallstones. There is mild intra and extrahepatic biliary dilatation with the common hepatic duct measuring up to 9 mm in diameter. There are multiple small stones within the common bile duct, best seen on series 8. Pancreas: As seen on recent CT, there is heterogeneous enlargement the proximal  pancreatic tail with a complex peripherally enhancing fluid collection in the lesser sac, measuring up to 6.6 x 2.7 cm (image 65/17). No obvious pancreatic mass or pancreatic ductal dilatation, although evaluation limited by motion. Spleen: Normal in size without focal abnormality. Adrenals/Urinary Tract: Both adrenal glands appear normal. No significant abnormalities of either kidney. There is no hydronephrosis. Stomach/Bowel: No evidence of bowel wall thickening, distention or surrounding inflammatory change. Vascular/Lymphatic: There are no enlarged abdominal lymph nodes. Aortic atherosclerosis without evidence of aneurysm. Other: Mild generalized soft tissue edema with a small amount of ascites. No other focal extraluminal fluid collections are seen. Musculoskeletal: There is a convex right thoracolumbar scoliosis associated with multiple compression deformities involving the T11, L1 and L3 vertebral bodies, similar to recent CT. Inferior endplate edema and enhancement are present on the left in the L4 and L5 vertebral bodies. There is corresponding sclerosis in the L5 vertebral body on recent CT, but no gross lytic lesion. IMPRESSION: 1. Cholelithiasis with choledocholithiasis and mild biliary dilatation. 2. Persistent heterogeneous enlargement of the proximal pancreatic tail with complex peripherally enhancing fluid collection in the lesser sac, similar to recent CT, and most consistent with pancreatitis. Patient has corresponding elevated serum lipase levels. These pancreatic findings are not well seen by this examination, in part due to motion artifact. CT  follow-up recommended to exclude neoplasm. 3. Mild generalized soft tissue edema and small bilateral pleural effusions with bibasilar atelectasis. 4. Grossly stable thoracolumbar compression deformities. Indeterminate inferior endplate edema and enhancement in the L4 and L5 vertebral bodies. Electronically Signed   By: Richardean Sale M.D.   On: 03/08/2019 15:33   DG Knee Complete 4 Views Right  Result Date: 03/01/2019 CLINICAL DATA:  Fall 6 weeks prior with pain in right knee EXAM: RIGHT KNEE - COMPLETE 4+ VIEW COMPARISON:  None. FINDINGS: The osseous structures appear diffusely demineralized which may limit detection of small or nondisplaced fractures. There is a questionable avulsion type fracture of the lateral tibial spine. Mild tricompartmental degenerative changes are present. Medial and lateral meniscal chondrocalcinosis is noted. No sizeable joint effusion. Atherosclerotic calcifications noted posterior to the knee. IMPRESSION: 1. Questionable avulsion fracture of the lateral tibial spine. 2. Diffuse osseous demineralization. 3. Tricompartmental degenerative changes with medial and lateral meniscal chondrocalcinosis may reflect underlying calcium pyrophosphate deposition. Electronically Signed   By: Lovena Le M.D.   On: 03/01/2019 17:17   Acute Abd Series  Result Date: 03/06/2019 CLINICAL DATA:  Hematuria, abdominal pain EXAM: DG ABDOMEN ACUTE W/ 1V CHEST COMPARISON:  CT 03/01/2019 FINDINGS: There is no evidence of dilated bowel loops or free intraperitoneal air. No radiopaque calculi or other significant radiographic abnormality is seen. Lung volumes are low. Patchy bibasilar opacities may reflect atelectasis. There are more nodular densities within the right mid lung and left upper lobe, new from prior which could represent foci of infection. Metastatic disease not excluded given the findings on recent CT. IMPRESSION: 1. No evidence of bowel obstruction or free intraperitoneal air. 2. New  nodular densities within the right mid lung and left upper lobe. Findings could represent infectious or inflammatory nodules. Metastatic disease not excluded given the findings on recent CT. 3. Patchy bibasilar opacities may reflect atelectasis. Electronically Signed   By: Davina Poke M.D.   On: 03/06/2019 17:52   MR ABDOMEN MRCP W WO CONTAST  Result Date: 03/08/2019 CLINICAL DATA:  Abdominal pain with jaundice for 2 months. History of gallstones. EXAM: MRI ABDOMEN WITHOUT AND WITH CONTRAST (INCLUDING MRCP) TECHNIQUE: Multiplanar multisequence  MR imaging of the abdomen was performed both before and after the administration of intravenous contrast. Heavily T2-weighted images of the biliary and pancreatic ducts were obtained, and three-dimensional MRCP images were rendered by post processing. CONTRAST:  15mL GADAVIST GADOBUTROL 1 MMOL/ML IV SOLN COMPARISON:  Abdominopelvic CT 03/06/2019 abdominal ultrasound 03/07/2019 FINDINGS: Despite efforts by the technologist and patient, moderate motion artifact is present on today's exam and could not be eliminated. This reduces exam sensitivity and specificity. Lower chest: There are small bilateral pleural effusions with dependent atelectasis at both lung bases. The heart is moderately enlarged. No significant pericardial effusion. Hepatobiliary: There are probable small hepatic cysts. No enhancing liver lesions are identified. The gallbladder is filled with multiple small gallstones. There is mild intra and extrahepatic biliary dilatation with the common hepatic duct measuring up to 9 mm in diameter. There are multiple small stones within the common bile duct, best seen on series 8. Pancreas: As seen on recent CT, there is heterogeneous enlargement the proximal pancreatic tail with a complex peripherally enhancing fluid collection in the lesser sac, measuring up to 6.6 x 2.7 cm (image 65/17). No obvious pancreatic mass or pancreatic ductal dilatation, although  evaluation limited by motion. Spleen: Normal in size without focal abnormality. Adrenals/Urinary Tract: Both adrenal glands appear normal. No significant abnormalities of either kidney. There is no hydronephrosis. Stomach/Bowel: No evidence of bowel wall thickening, distention or surrounding inflammatory change. Vascular/Lymphatic: There are no enlarged abdominal lymph nodes. Aortic atherosclerosis without evidence of aneurysm. Other: Mild generalized soft tissue edema with a small amount of ascites. No other focal extraluminal fluid collections are seen. Musculoskeletal: There is a convex right thoracolumbar scoliosis associated with multiple compression deformities involving the T11, L1 and L3 vertebral bodies, similar to recent CT. Inferior endplate edema and enhancement are present on the left in the L4 and L5 vertebral bodies. There is corresponding sclerosis in the L5 vertebral body on recent CT, but no gross lytic lesion. IMPRESSION: 1. Cholelithiasis with choledocholithiasis and mild biliary dilatation. 2. Persistent heterogeneous enlargement of the proximal pancreatic tail with complex peripherally enhancing fluid collection in the lesser sac, similar to recent CT, and most consistent with pancreatitis. Patient has corresponding elevated serum lipase levels. These pancreatic findings are not well seen by this examination, in part due to motion artifact. CT follow-up recommended to exclude neoplasm. 3. Mild generalized soft tissue edema and small bilateral pleural effusions with bibasilar atelectasis. 4. Grossly stable thoracolumbar compression deformities. Indeterminate inferior endplate edema and enhancement in the L4 and L5 vertebral bodies. Electronically Signed   By: Richardean Sale M.D.   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  Evidence of  choledocholithiasis biliary dilatation.  Also with 6.6 x 2.7 cm fluid collection in the lesser sac and heterogeneous enlargement of the proximal pancreatic tail consistent with acute pancreatitis.    ERCP yesterday was canceled at our facility as she was felt to be high risk of anesthesiology.  It was recommended that she be transferred to [facility] for ERCP with sphincterotomy and stone extraction but patient declined transfer and procedure.  Per hospitalist, patient's daughter aware of patient's decision and both daughter and Dr. Billie Ruddy believe that patient has decision-making capacity and will proceed according to patient's wishes.  LFTs today with total bilirubin of 1.9, stable, alkaline phosphatase 198 slightly elevated, AST and ALT normal at  41 and 44 respectively (down from 43 and 50).  White blood cell count increased from 12,000-15,000.  Patient is on doxycycline for UTI.   Plan: 1. Patient declining transfer and per attending, declining ERCP. Has been discussed with patient's daughter. Spoke with patient today and continues to decline ERCP. Discussed risk of cholangitis and death. Patient did not provide any response to this part of the discussion.  2. With rising leukocytosis, this could be due to pancreatitis vs early cholangitis. Consider empiric antibiotics. Will discuss with Dr. Gala Romney.   Laureen Ochs. Bernarda Caffey [facility] [phone] 12/16/20201:00 PM     LOS: 4 days

## 2019-03-11 DIAGNOSIS — Z515 Encounter for palliative care: Secondary | ICD-10-CM

## 2019-03-11 DIAGNOSIS — Z7189 Other specified counseling: Secondary | ICD-10-CM

## 2019-03-11 DIAGNOSIS — Z66 Do not resuscitate: Secondary | ICD-10-CM

## 2019-03-11 DIAGNOSIS — C679 Malignant neoplasm of bladder, unspecified: Secondary | ICD-10-CM | POA: Diagnosis present

## 2019-03-11 LAB — COMPREHENSIVE METABOLIC PANEL
ALT: 33 U/L (ref 0–44)
AST: 31 U/L (ref 15–41)
Albumin: 2 g/dL — ABNORMAL LOW (ref 3.5–5.0)
Alkaline Phosphatase: 176 U/L — ABNORMAL HIGH (ref 38–126)
Anion gap: 9 (ref 5–15)
BUN: 14 mg/dL (ref 8–23)
CO2: 25 mmol/L (ref 22–32)
Calcium: 8.6 mg/dL — ABNORMAL LOW (ref 8.9–10.3)
Chloride: 101 mmol/L (ref 98–111)
Creatinine, Ser: 0.63 mg/dL (ref 0.44–1.00)
GFR calc Af Amer: >60 mL/min (ref >60)
GFR calc non Af Amer: >60 mL/min (ref >60)
Glucose, Bld: 104 mg/dL — ABNORMAL HIGH (ref 70–99)
Potassium: 3.1 mmol/L — ABNORMAL LOW (ref 3.5–5.1)
Sodium: 135 mmol/L (ref 135–145)
Total Bilirubin: 1.8 mg/dL — ABNORMAL HIGH (ref 0.3–1.2)
Total Protein: 5.1 g/dL — ABNORMAL LOW (ref 6.5–8.1)

## 2019-03-11 LAB — CBC
HCT: 32.9 % — ABNORMAL LOW (ref 36.0–46.0)
Hemoglobin: 10.3 g/dL — ABNORMAL LOW (ref 12.0–15.0)
MCH: 26.6 pg (ref 26.0–34.0)
MCHC: 31.3 g/dL (ref 30.0–36.0)
MCV: 85 fL (ref 80.0–100.0)
Platelets: 176 10*3/uL (ref 150–400)
RBC: 3.87 MIL/uL (ref 3.87–5.11)
RDW: 15.9 % — ABNORMAL HIGH (ref 11.5–15.5)
WBC: 14 10*3/uL — ABNORMAL HIGH (ref 4.0–10.5)
nRBC: 0 % (ref 0.0–0.2)

## 2019-03-11 LAB — LIPASE, BLOOD: Lipase: 369 U/L — ABNORMAL HIGH (ref 11–51)

## 2019-03-11 MED ORDER — MORPHINE SULFATE (PF) 2 MG/ML IV SOLN
1.0000 mg | INTRAVENOUS | Status: DC
  Administered 2019-03-11: 1 mg via INTRAVENOUS
  Filled 2019-03-11: qty 1

## 2019-03-11 MED ORDER — MORPHINE SULFATE (CONCENTRATE) 10 MG/0.5ML PO SOLN: 5.0000 mg | ORAL | Status: DC | PRN

## 2019-03-11 MED ORDER — LORAZEPAM 1 MG PO TABS: 1.0000 mg | ORAL_TABLET | ORAL | Status: DC | PRN

## 2019-03-11 MED ORDER — ACETAMINOPHEN 325 MG PO TABS: 650.0000 mg | ORAL_TABLET | Freq: Four times a day (QID) | ORAL | 0 refills | Status: AC | PRN

## 2019-03-11 MED ORDER — LORAZEPAM 2 MG/ML IJ SOLN: 1.0000 mg | INTRAMUSCULAR | Status: DC | PRN

## 2019-03-11 MED ORDER — HALOPERIDOL LACTATE 2 MG/ML PO CONC
0.5000 mg | ORAL | Status: DC | PRN
  Filled 2019-03-11: qty 0.3

## 2019-03-11 MED ORDER — POLYVINYL ALCOHOL 1.4 % OP SOLN: 1.0000 [drp] | Freq: Four times a day (QID) | OPHTHALMIC | 0 refills | Status: AC | PRN

## 2019-03-11 MED ORDER — BIOTENE DRY MOUTH MT LIQD: 15.0000 mL | OROMUCOSAL | Status: DC | PRN

## 2019-03-11 MED ORDER — MORPHINE SULFATE (PF) 2 MG/ML IV SOLN
1.0000 mg | INTRAVENOUS | Status: DC | PRN
  Administered 2019-03-11: 1 mg via INTRAVENOUS
  Filled 2019-03-11: qty 1

## 2019-03-11 MED ORDER — POLYVINYL ALCOHOL 1.4 % OP SOLN: 1.0000 [drp] | Freq: Four times a day (QID) | OPHTHALMIC | Status: DC | PRN

## 2019-03-11 MED ORDER — SODIUM CHLORIDE 0.9% FLUSH
3.0000 mL | Freq: Two times a day (BID) | INTRAVENOUS | Status: DC
  Administered 2019-03-11: 3 mL via INTRAVENOUS

## 2019-03-11 MED ORDER — SODIUM CHLORIDE 0.9 % IV SOLN: 250.0000 mL | INTRAVENOUS | Status: DC | PRN

## 2019-03-11 MED ORDER — HALOPERIDOL LACTATE 5 MG/ML IJ SOLN: 0.5000 mg | INTRAMUSCULAR | Status: DC | PRN

## 2019-03-11 MED ORDER — SODIUM CHLORIDE 0.9% FLUSH: 3.0000 mL | INTRAVENOUS | Status: DC | PRN

## 2019-03-11 MED ORDER — ALUM & MAG HYDROXIDE-SIMETH 200-200-20 MG/5ML PO SUSP: 15.0000 mL | ORAL | Status: DC | PRN

## 2019-03-11 MED ORDER — BIOTENE DRY MOUTH MT LIQD: 15.0000 mL | OROMUCOSAL | 0 refills | Status: AC | PRN

## 2019-03-11 MED ORDER — HALOPERIDOL 0.5 MG PO TABS: 0.5000 mg | ORAL_TABLET | ORAL | Status: DC | PRN

## 2019-03-11 MED ORDER — GLYCOPYRROLATE 0.2 MG/ML IJ SOLN: 0.2000 mg | INTRAMUSCULAR | Status: DC | PRN

## 2019-03-11 MED ORDER — MORPHINE SULFATE (PF) 2 MG/ML IV SOLN: 1.0000 mg | INTRAVENOUS | Status: DC | PRN

## 2019-03-11 MED ORDER — POTASSIUM CHLORIDE CRYS ER 20 MEQ PO TBCR
40.0000 meq | EXTENDED_RELEASE_TABLET | ORAL | Status: DC
  Administered 2019-03-11: 40 meq via ORAL
  Filled 2019-03-11: qty 2

## 2019-03-11 MED ORDER — GLYCOPYRROLATE 1 MG PO TABS: 1.0000 mg | ORAL_TABLET | ORAL | Status: DC | PRN

## 2019-03-11 MED ORDER — ONDANSETRON HCL 4 MG PO TABS: 4.0000 mg | ORAL_TABLET | Freq: Four times a day (QID) | ORAL | 0 refills | Status: AC | PRN

## 2019-03-11 NOTE — Progress Notes (Signed)
Hospice Choices:   [facility] [phone] Ownership For-Profit 2. [facility] [phone] Ownership For-Profit 3. [facility] [phone] Ownership Non-Profit 4. [facility] [phone] Ownership Non-Profit 5. [facility] [phone] Ownership Non-Profit 6. [facility] [phone] Ownership Non-Profit 7. [facility] [phone] 

## 2019-03-11 NOTE — TOC Progression Note (Signed)
Transition of Care [facility]) - Progression Note    Patient Details  Name: Brandy Fitzpatrick MRN: [id number] Date of Birth: 07/29/1919  Transition of [facility]) CM/SW Contact  Boneta Lucks, RN Phone Number: 03/11/2019, 1:58 PM  Clinical Narrative:   Palliative is recommending hospice. Daughter- Marlowe Kays is requesting [facility].  Called [facility], they have beds. Clinicals sent in [facility], [facility] awaiting hospice assessment and transport time.     Expected Discharge Plan: [place] Barriers to Discharge: No Barriers Identified  Expected Discharge Plan and Services Expected Discharge Plan: [place]     Readmission Risk Interventions Readmission Risk Prevention Plan 03/04/2019  Post Dischage Appt Not Complete  Appt Comments currently attempting to schedule appointment  Medication Screening Complete  Transportation Screening Complete  Some recent data might be hidden

## 2019-03-11 NOTE — Consult Note (Signed)
Consultation Note Date: 03/11/2019   Patient Name: Brandy Fitzpatrick  DOB: 1919/04/23  MRN: [id number]  Age / Sex: 85 y.o., female  PCP: Loman Brooklyn, FNP Referring Physician: Roxan Hockey, MD  Reason for Consultation: Establishing goals of care  HPI/Patient Profile: 85 y.o. female  with past medical history of recently diagnosed bladder cancer with gross hematuria and clots with no further interventions planned, frequent falls, hypothryoidism, blindness in the right eye, mitral regurgitation admitted on 03/06/2019 with increasing weakness, abdominal pain, nausea. Workup revealed pancreatitis with choledocholithiasis biliary dialation. ERCP was recommended but patient declined. Palliative medicine consulted for goals of care.   Clinical Assessment and Goals of Care: Patient in bed, lethargic. Moaning in pain. She is not eating or drinking. Met with her daughterTomie China at bedside.  Discussed patient's current illness and possible trajectories. Discussed that without ERCP she is at end of life.  Marlowe Kays stated that patient had adamantly stated she was "done". Patient does not want to be "poked and prodded" any longer. She knows that given her bladder mass, and her current infection that her time is limited. She would prefer to spend it with maximum comfort.  Marlowe Kays notes that on the way to the hospital patient told her she was "ready to go". She even pointed out [place] and stated she would like to go there.  Differences between continued aggressive care vs transition to full comfort measures was discussed. Marlowe Kays states that transition to full comfort measures would be her mother's choice for goals of care.  Patient will need symptom management and care- Marlowe Kays requests referral for possible bed at [facility] in [place].   Primary Decision Maker NEXT OF KIN- patient's  daughter- Tomie China    SUMMARY OF RECOMMENDATIONS -D/C all current life prolonging interventions including IV fluids, IV antibiotics -Transition to full comfort -Morphine IV 67m q4hr scheduled for pain -Comfort measures as ordered  -Transition of care order for referral to residential hospice house   Code Status/Advance Care Planning:  DNR  Additional Recommendations (Limitations, Scope, Preferences):  Full Comfort Care  Prognosis:    < 2 weeks due to bleeding bladder mass, pancreatitis, cholangitis, no po intake- transition to full comfort measures  Discharge Planning: Hospice facility  Primary Diagnoses: Present on Admission: . Pancreatitis   I have reviewed the medical record, interviewed the patient and family, and examined the patient. The following aspects are pertinent.  Past Medical History:  Diagnosis Date  . Blind right eye 2017   possible stroke in eye; nerves burned so it would no longer cause pain  . Fracture of unspecified part of neck of left femur, initial encounter for closed fracture [place] 05/16/2015  . Renal disorder   . Thyroid disease    Social History   Socioeconomic History  . Marital status: Widowed    Spouse name: Not on file  . Number of children: Not on file  . Years of education: Not on file  . Highest education level: Not on file  Occupational History  . Not on file  Tobacco Use  . Smoking status: Never Smoker  . Smokeless tobacco: Never Used  Substance and Sexual Activity  . Alcohol use: Never  . Drug use: Never  . Sexual activity: Not Currently  Other Topics Concern  . Not on file  Social History Narrative  . Not on file   Social Determinants of Health   Financial Resource Strain:   . Difficulty of Paying Living Expenses: Not on file  Food Insecurity:   . Worried About Charity fundraiser in the Last Year: Not on file  . Ran Out of Food in the Last Year: Not on file  Transportation Needs:   . Lack of Transportation  (Medical): Not on file  . Lack of Transportation (Non-Medical): Not on file  Physical Activity:   . Days of Exercise per Week: Not on file  . Minutes of Exercise per Session: Not on file  Stress:   . Feeling of Stress : Not on file  Social Connections:   . Frequency of Communication with Friends and Family: Not on file  . Frequency of Social Gatherings with Friends and Family: Not on file  . Attends Religious Services: Not on file  . Active Member of Clubs or Organizations: Not on file  . Attends Archivist Meetings: Not on file  . Marital Status: Not on file   Family History  Problem Relation Age of Onset  . Stroke Mother   . Heart disease Mother   . Stroke Father   . Heart disease Father   . Parkinson's disease Sister   . Dementia Sister   . Stroke Sister   . Heart attack Brother    Scheduled Meds: . Chlorhexidine Gluconate Cloth  6 each Topical Daily  . levothyroxine  112 mcg Oral Q0600  . pantoprazole (PROTONIX) IV  40 mg Intravenous Q24H  . polyethylene glycol  34 g Oral TID   Continuous Infusions: . sodium chloride     PRN Meds:.acetaminophen **OR** acetaminophen, alum & mag hydroxide-simeth, morphine injection, ondansetron **OR** ondansetron (ZOFRAN) IV Medications Prior to Admission:  Prior to Admission medications   Medication Sig Start Date End Date Taking? Authorizing Provider  aspirin 81 MG chewable tablet Chew 81 mg by mouth daily.    Yes [provider]  digoxin (LANOXIN) 0.125 MG tablet Take 1 tablet (0.125 mg total) by mouth daily. 10/08/18  Yes Hendricks Limes F, FNP  doxycycline (VIBRA-TABS) 100 MG tablet Take 1 tablet (100 mg total) by mouth 2 (two) times daily for 7 days. 03/04/19 03/11/19 Yes Barton Dubois, MD  levothyroxine (SYNTHROID) 112 MCG tablet Take 1 tablet (112 mcg total) by mouth daily before breakfast. 10/08/18  Yes Loman Brooklyn, FNP  pantoprazole (PROTONIX) 40 MG tablet Take 1 tablet (40 mg total) by mouth daily.  03/05/19  Yes Barton Dubois, MD  Propylene Glycol (SYSTANE BALANCE) 0.6 % SOLN Apply 1-2 drops to eye 4 (four) times daily as needed. 02/09/19  Yes Hendricks Limes F, FNP  triamcinolone cream (KENALOG) 0.1 % Apply 1 application topically 2 (two) times daily. Patient taking differently: Apply 1 application topically 2 (two) times daily as needed (for skin irritation).  02/09/19  Yes Loman Brooklyn, FNP   Allergies  Allergen Reactions  . Codeine Rash  . Propoxyphene Rash  . Sulfa Antibiotics Rash   Review of Systems  Unable to perform ROS: Acuity of condition    Physical Exam Vitals and nursing note reviewed.  Constitutional:      Comments: Lethargic, cachetic  Skin:    Coloration: Skin is jaundiced.     Vital Signs: BP 130/72 (BP Location: Right Arm)   Pulse 79   Temp 97.9 F (36.6 C) (Oral)   Resp 18   Ht _0  (1.6 m)   Wt 54.2 kg   SpO2 90%   BMI 21.17 kg/m  Pain Scale: 0-10   Pain Score: 2    SpO2: SpO2: 90 % O2 Device:SpO2: 90 % O2 Flow Rate: .   IO: Intake/output summary:   Intake/Output Summary (Last 24 hours) at 03/11/2019 1150 Last data filed at 03/11/2019 0741 Gross per 24 hour  Intake 2418.95 ml  Output 1450 ml  Net 968.95 ml    LBM: Last BM Date: 03/10/19 Baseline Weight: Weight: 48.2 kg Most recent weight: Weight: 54.2 kg     Palliative Assessment/Data: PPS: 20%     Thank you for this consult. Palliative medicine will continue to follow and assist as needed.   Time In: 1030 Time Out: 1200 Time Total: 90 minutes Greater than 50%  of this time was spent counseling and coordinating care related to the above assessment and plan.  Signed by: Mariana Kaufman, AGNP-C Palliative Medicine    Please contact Palliative Medicine Team phone at [phone] for questions and concerns.  For individual provider: See Shea Evans

## 2019-03-11 NOTE — Progress Notes (Addendum)
Subjective:  Patient states she has a good bowel movement yesterday. Felt better. This morning, drank some OJ but "that wasn't a good idea". Drank some coffee. Feels nauseated. Upper abdominal pain.   Objective: Vital signs in last 24 hours: Temp:  [97.9 F (36.6 C)-99.3 F (37.4 C)] 97.9 F (36.6 C) (12/17 0530) Pulse Rate:  [79-87] 79 (12/17 0530) Resp:  [18-20] 18 (12/17 0530) BP: (111-130)/(71-75) 130/72 (12/17 0530) SpO2:  [90 %-94 %] 90 % (12/17 0530) Last BM Date: 03/10/19 General:   Alert,  Hard of hearing. Resting comfortably. Head:  Normocephalic and atraumatic. Eyes:  Sclera clear, no icterus.  Abdomen:  Soft, mild to moderate tenderness ruq/epigastric region. Normal bowel sounds. Neurologic:  Alert and  oriented x 3. Psych:  Alert and cooperative. Normal mood and affect.  Intake/Output from previous day: 12/16 0701 - 12/17 0700 In: 2659 [P.O.:600; I.V.:1709; IV Piggyback:350] Out: 1350 [id number] Intake/Output this shift: Total I/O In: -  Out: 100 [Urine:100]  Lab Results: CBC Recent Labs    03/09/19 0552 03/10/19 0106 03/11/19 0607  WBC 12.6* 15.5* 14.0*  HGB 10.7* 11.2* 10.3*  HCT 33.8* 35.2* 32.9*  MCV 85.6 84.6 85.0  PLT 180 180 176   BMET Recent Labs    03/09/19 0552 03/10/19 0106 03/11/19 0607  NA 133* 131* 135  K 3.7 3.5 3.1*  CL 102 102 101  CO2 23 21* 25  GLUCOSE 82 113* 104*  BUN 13 12 14   CREATININE 0.62 0.61 0.63  CALCIUM 8.8* 8.7* 8.6*   LFTs Recent Labs    03/09/19 0552 03/10/19 0106 03/11/19 0607  BILITOT 1.8* 1.9* 1.8*  BILIDIR 0.7* 0.8*  --   IBILI 1.1* 1.1*  --   ALKPHOS 176* 198* 176*  AST 43* 41 31  ALT 50* 44 33  PROT 5.3* 5.6* 5.1*  ALBUMIN 2.2* 2.3* 2.0*   Recent Labs    03/09/19 0552 03/11/19 0607  LIPASE 143* 369*   PT/INR No results for input(s): LABPROT, INR in the last 72 hours.    Imaging Studies: CT Chest W Contrast  Result Date: 03/06/2019 CLINICAL DATA:  Abnormal chest x-ray with  findings suspicious for pulmonary nodules EXAM: CT CHEST, ABDOMEN, AND PELVIS WITH CONTRAST TECHNIQUE: Multidetector CT imaging of the chest, abdomen and pelvis was performed following the standard protocol during bolus administration of intravenous contrast. CONTRAST:  171mL OMNIPAQUE IOHEXOL 300 MG/ML  SOLN COMPARISON:  Chest x-ray from earlier in the same day, CT from 08/06/2005. FINDINGS: CT CHEST FINDINGS Cardiovascular: Thoracic aorta demonstrates a normal branching pattern. No aneurysmal dilatation or dissection is seen. Atherosclerotic calcifications are noted of a mild degree. Heart is mildly enlarged. Coronary calcifications are seen. The pulmonary artery shows no large central pulmonary embolus. Mediastinum/Nodes: Thoracic inlet is within normal limits. No hilar or mediastinal adenopathy is noted. The esophagus is within normal limits. Lungs/Pleura: The lungs are well aerated bilaterally. There are patchy areas of atelectasis/early infiltrate with small effusions in the lower lobes bilaterally. Multiple scattered pulmonary nodules are seen in both lungs. The largest of these on the left measures almost 10 mm in greatest dimension. The largest of these on the right measures almost 2 cm with some enhancement identified. These likely represent metastatic disease. No pneumothorax is seen. Musculoskeletal: Degenerative changes of the thoracic spine are noted. Increased kyphosis is seen. No definitive acute rib abnormality is noted. Chronic appearing compression deformities are noted at T4, T5 and T11. CT ABDOMEN PELVIS FINDINGS Hepatobiliary: Liver is well visualized  without focal mass. Slight increase in intrahepatic biliary ductal dilatation is noted. The gallbladder is well distended with evidence of gallstones and gallbladder sludge stable from the prior exam. No pericholecystic fluid is noted. Common bile duct is dilated and slightly increased when compared with the prior exam likely related to the  underlying pancreatitis. Pancreas: Pancreas demonstrates diffuse mottled enhancement with evidence of a fluid collection measuring 5.6 x 2.1 cm in greatest transverse and AP dimensions respectively along the anterior aspect of the pancreatic body. This was not present on the prior exam and is consistent with acute pancreatitis. Diffuse edema throughout the majority of the pancreas body is noted consistent with the pancreatitis. This corresponds with the significantly elevated lipase on laboratory values. Spleen: Normal in size without focal abnormality. Adrenals/Urinary Tract: Adrenal glands are within normal limits. Kidneys demonstrate a normal enhancement pattern bilaterally. Normal excretion of contrast is seen. The bladder is decompressed by Foley catheter. Previously seen left lateral bladder mass is noted but incompletely evaluated due to the decompression of the bladder. Stomach/Bowel: No obstructive or inflammatory changes of the colon are noted. The appendix is well visualized best seen on the coronal imaging and within normal limits. Small bowel is within normal limits. No gastric abnormality is seen. Vascular/Lymphatic: Aortic atherosclerosis. No enlarged abdominal or pelvic lymph nodes. Reproductive: Uterus and bilateral adnexa are unremarkable. Other: No significant free pelvic fluid is noted. Mild perihepatic fluid is noted likely related to the underlying pancreatitis. Musculoskeletal: L1 and L3 compression deformities are again seen. Degenerative changes are noted. IMPRESSION: CT of the chest: New bibasilar atelectatic changes with small effusions. Multiple bilateral pulmonary nodules the largest of which lies in the right lower lobe posteriorly as described. Given the patient's known bladder mass disease are likely metastatic in nature. Stable compression deformities are noted. CT of the abdomen and pelvis: New changes of acute pancreatitis within the body of the pancreas with a fluid collection  noted along the anterior aspect of the pancreas as well as diffuse pancreatic edema. Some mild free fluid is noted related to these changes as well. Stable dilatation of the gallbladder with gallbladder sludge and gallstones. Slight increase in the degree of intrahepatic and extrahepatic biliary ductal dilatation. This is likely related to the underlying pancreatitis. Bladder is decompressed although the known left bladder mass is again noted. Electronically Signed   By: Inez Catalina M.D.   On: 03/06/2019 21:06   Abdominal ultrasound  Result Date: 03/07/2019 CLINICAL DATA:  Pancreatitis. EXAM: ABDOMEN ULTRASOUND COMPLETE COMPARISON:  March 06, 2019. FINDINGS: Gallbladder: Cholelithiasis is noted without gallbladder wall thickening or pericholecystic fluid. No sonographic Murphy's sign is noted. Common bile duct: Diameter: 8 mm which is mildly dilated. Liver: No focal lesion identified. Within normal limits in parenchymal echogenicity. Portal vein is patent on color Doppler imaging with normal direction of blood flow towards the liver. IVC: No abnormality visualized. Pancreas: Pancreatic head appears normal. Body and tail not well visualized due to overlying bowel gas. Spleen: Size and appearance within normal limits. Right Kidney: Length: 11.5 cm. Echogenicity within normal limits. No mass or hydronephrosis visualized. Left Kidney: Length: 10.3 cm. Echogenicity within normal limits. No mass or hydronephrosis visualized. Abdominal aorta: No aneurysm visualized. Other findings: None. IMPRESSION: Cholelithiasis without cholecystitis. Pancreatic head appears normal. Body and tail not well visualized due to overlying bowel gas. Common bile duct is mildly dilated at 8 mm. Correlation with liver function tests is recommended to rule out distal common bile duct obstruction. Electronically Signed  By: Marijo Conception M.D.   On: 03/07/2019 11:46   CT ABDOMEN PELVIS W CONTRAST  Result Date: 03/06/2019 CLINICAL  DATA:  Abnormal chest x-ray with findings suspicious for pulmonary nodules EXAM: CT CHEST, ABDOMEN, AND PELVIS WITH CONTRAST TECHNIQUE: Multidetector CT imaging of the chest, abdomen and pelvis was performed following the standard protocol during bolus administration of intravenous contrast. CONTRAST:  120mL OMNIPAQUE IOHEXOL 300 MG/ML  SOLN COMPARISON:  Chest x-ray from earlier in the same day, CT from 08/06/2005. FINDINGS: CT CHEST FINDINGS Cardiovascular: Thoracic aorta demonstrates a normal branching pattern. No aneurysmal dilatation or dissection is seen. Atherosclerotic calcifications are noted of a mild degree. Heart is mildly enlarged. Coronary calcifications are seen. The pulmonary artery shows no large central pulmonary embolus. Mediastinum/Nodes: Thoracic inlet is within normal limits. No hilar or mediastinal adenopathy is noted. The esophagus is within normal limits. Lungs/Pleura: The lungs are well aerated bilaterally. There are patchy areas of atelectasis/early infiltrate with small effusions in the lower lobes bilaterally. Multiple scattered pulmonary nodules are seen in both lungs. The largest of these on the left measures almost 10 mm in greatest dimension. The largest of these on the right measures almost 2 cm with some enhancement identified. These likely represent metastatic disease. No pneumothorax is seen. Musculoskeletal: Degenerative changes of the thoracic spine are noted. Increased kyphosis is seen. No definitive acute rib abnormality is noted. Chronic appearing compression deformities are noted at T4, T5 and T11. CT ABDOMEN PELVIS FINDINGS Hepatobiliary: Liver is well visualized without focal mass. Slight increase in intrahepatic biliary ductal dilatation is noted. The gallbladder is well distended with evidence of gallstones and gallbladder sludge stable from the prior exam. No pericholecystic fluid is noted. Common bile duct is dilated and slightly increased when compared with the prior  exam likely related to the underlying pancreatitis. Pancreas: Pancreas demonstrates diffuse mottled enhancement with evidence of a fluid collection measuring 5.6 x 2.1 cm in greatest transverse and AP dimensions respectively along the anterior aspect of the pancreatic body. This was not present on the prior exam and is consistent with acute pancreatitis. Diffuse edema throughout the majority of the pancreas body is noted consistent with the pancreatitis. This corresponds with the significantly elevated lipase on laboratory values. Spleen: Normal in size without focal abnormality. Adrenals/Urinary Tract: Adrenal glands are within normal limits. Kidneys demonstrate a normal enhancement pattern bilaterally. Normal excretion of contrast is seen. The bladder is decompressed by Foley catheter. Previously seen left lateral bladder mass is noted but incompletely evaluated due to the decompression of the bladder. Stomach/Bowel: No obstructive or inflammatory changes of the colon are noted. The appendix is well visualized best seen on the coronal imaging and within normal limits. Small bowel is within normal limits. No gastric abnormality is seen. Vascular/Lymphatic: Aortic atherosclerosis. No enlarged abdominal or pelvic lymph nodes. Reproductive: Uterus and bilateral adnexa are unremarkable. Other: No significant free pelvic fluid is noted. Mild perihepatic fluid is noted likely related to the underlying pancreatitis. Musculoskeletal: L1 and L3 compression deformities are again seen. Degenerative changes are noted. IMPRESSION: CT of the chest: New bibasilar atelectatic changes with small effusions. Multiple bilateral pulmonary nodules the largest of which lies in the right lower lobe posteriorly as described. Given the patient's known bladder mass disease are likely metastatic in nature. Stable compression deformities are noted. CT of the abdomen and pelvis: New changes of acute pancreatitis within the body of the pancreas  with a fluid collection noted along the anterior aspect of the  pancreas as well as diffuse pancreatic edema. Some mild free fluid is noted related to these changes as well. Stable dilatation of the gallbladder with gallbladder sludge and gallstones. Slight increase in the degree of intrahepatic and extrahepatic biliary ductal dilatation. This is likely related to the underlying pancreatitis. Bladder is decompressed although the known left bladder mass is again noted. Electronically Signed   By: Inez Catalina M.D.   On: 03/06/2019 21:06   CT ABDOMEN PELVIS W CONTRAST  Result Date: 03/01/2019 CLINICAL DATA:  Hematuria, abdominal distension EXAM: CT ABDOMEN AND PELVIS WITH CONTRAST TECHNIQUE: Multidetector CT imaging of the abdomen and pelvis was performed using the standard protocol following bolus administration of intravenous contrast. CONTRAST:  49mL OMNIPAQUE IOHEXOL 300 MG/ML  SOLN COMPARISON:  None. FINDINGS: Motion degraded images. Lower chest: Emphysematous changes with bronchiectasis in the right middle lobe, lingula, and bilateral lower lobes. 8 mm medial right lower lobe nodule (series 4/image 4), grossly unchanged from 2007, benign. Hepatobiliary: Liver is within normal limits. Multiple gallstones, without associated inflammatory changes. No intrahepatic or extrahepatic ductal dilatation. Pancreas: Within normal limits. Spleen: Within normal limits. Adrenals/Urinary Tract: Adrenal glands are within normal limits. Kidneys are grossly unremarkable.  No hydronephrosis. Large left inferolateral bladder mass measuring at least 6.4 x 3.9 cm (series 2/image 70), suspicious for primary bladder neoplasm. Additional 1.2 x 2.8 cm lesion along the right anterior bladder (series 2/image 69), also worrisome for tumor. Stomach/Bowel: Stomach is within normal limits. No evidence of bowel obstruction. Appendix is not discretely visualized. Vascular/Lymphatic: No evidence of abdominal aortic aneurysm. Atherosclerotic  calcifications of the abdominal aorta and branch vessels. No suspicious abdominopelvic lymphadenopathy. Reproductive: Uterus is within normal limits. No adnexal masses, noting streak artifact obscures the left pelvis. Other: No abdominopelvic ascites. Musculoskeletal: Moderate to severe compression fracture deformities at T11 and L1 (sagittal image 41), unchanged from 2015. Mild superior endplate compression fracture deformity at L3, also unchanged. No retropulsion. Left hip arthroplasty, without evidence of complication. IMPRESSION: Motion degraded images. Multifocal bladder masses, suspicious for primary bladder neoplasm. Urology consultation is suggested for cystoscopy if tissue diagnosis is desired. No findings specific for synchronous or metastatic disease. 8 mm medial right lower lobe nodule, grossly unchanged from 2007, benign. Cholelithiasis, without associated inflammatory changes. Electronically Signed   By: Julian Hy M.D.   On: 03/01/2019 17:18   MR 3D Recon At Scanner  Result Date: 03/08/2019 CLINICAL DATA:  Abdominal pain with jaundice for 2 months. History of gallstones. EXAM: MRI ABDOMEN WITHOUT AND WITH CONTRAST (INCLUDING MRCP) TECHNIQUE: Multiplanar multisequence MR imaging of the abdomen was performed both before and after the administration of intravenous contrast. Heavily T2-weighted images of the biliary and pancreatic ducts were obtained, and three-dimensional MRCP images were rendered by post processing. CONTRAST:  18mL GADAVIST GADOBUTROL 1 MMOL/ML IV SOLN COMPARISON:  Abdominopelvic CT 03/06/2019 abdominal ultrasound 03/07/2019 FINDINGS: Despite efforts by the technologist and patient, moderate motion artifact is present on today's exam and could not be eliminated. This reduces exam sensitivity and specificity. Lower chest: There are small bilateral pleural effusions with dependent atelectasis at both lung bases. The heart is moderately enlarged. No significant pericardial  effusion. Hepatobiliary: There are probable small hepatic cysts. No enhancing liver lesions are identified. The gallbladder is filled with multiple small gallstones. There is mild intra and extrahepatic biliary dilatation with the common hepatic duct measuring up to 9 mm in diameter. There are multiple small stones within the common bile duct, best seen on series 8. Pancreas: As  seen on recent CT, there is heterogeneous enlargement the proximal pancreatic tail with a complex peripherally enhancing fluid collection in the lesser sac, measuring up to 6.6 x 2.7 cm (image 65/17). No obvious pancreatic mass or pancreatic ductal dilatation, although evaluation limited by motion. Spleen: Normal in size without focal abnormality. Adrenals/Urinary Tract: Both adrenal glands appear normal. No significant abnormalities of either kidney. There is no hydronephrosis. Stomach/Bowel: No evidence of bowel wall thickening, distention or surrounding inflammatory change. Vascular/Lymphatic: There are no enlarged abdominal lymph nodes. Aortic atherosclerosis without evidence of aneurysm. Other: Mild generalized soft tissue edema with a small amount of ascites. No other focal extraluminal fluid collections are seen. Musculoskeletal: There is a convex right thoracolumbar scoliosis associated with multiple compression deformities involving the T11, L1 and L3 vertebral bodies, similar to recent CT. Inferior endplate edema and enhancement are present on the left in the L4 and L5 vertebral bodies. There is corresponding sclerosis in the L5 vertebral body on recent CT, but no gross lytic lesion. IMPRESSION: 1. Cholelithiasis with choledocholithiasis and mild biliary dilatation. 2. Persistent heterogeneous enlargement of the proximal pancreatic tail with complex peripherally enhancing fluid collection in the lesser sac, similar to recent CT, and most consistent with pancreatitis. Patient has corresponding elevated serum lipase levels. These  pancreatic findings are not well seen by this examination, in part due to motion artifact. CT follow-up recommended to exclude neoplasm. 3. Mild generalized soft tissue edema and small bilateral pleural effusions with bibasilar atelectasis. 4. Grossly stable thoracolumbar compression deformities. Indeterminate inferior endplate edema and enhancement in the L4 and L5 vertebral bodies. Electronically Signed   By: Richardean Sale M.D.   On: 03/08/2019 15:33   DG Knee Complete 4 Views Right  Result Date: 03/01/2019 CLINICAL DATA:  Fall 6 weeks prior with pain in right knee EXAM: RIGHT KNEE - COMPLETE 4+ VIEW COMPARISON:  None. FINDINGS: The osseous structures appear diffusely demineralized which may limit detection of small or nondisplaced fractures. There is a questionable avulsion type fracture of the lateral tibial spine. Mild tricompartmental degenerative changes are present. Medial and lateral meniscal chondrocalcinosis is noted. No sizeable joint effusion. Atherosclerotic calcifications noted posterior to the knee. IMPRESSION: 1. Questionable avulsion fracture of the lateral tibial spine. 2. Diffuse osseous demineralization. 3. Tricompartmental degenerative changes with medial and lateral meniscal chondrocalcinosis may reflect underlying calcium pyrophosphate deposition. Electronically Signed   By: Lovena Le M.D.   On: 03/01/2019 17:17   Acute Abd Series  Result Date: 03/06/2019 CLINICAL DATA:  Hematuria, abdominal pain EXAM: DG ABDOMEN ACUTE W/ 1V CHEST COMPARISON:  CT 03/01/2019 FINDINGS: There is no evidence of dilated bowel loops or free intraperitoneal air. No radiopaque calculi or other significant radiographic abnormality is seen. Lung volumes are low. Patchy bibasilar opacities may reflect atelectasis. There are more nodular densities within the right mid lung and left upper lobe, new from prior which could represent foci of infection. Metastatic disease not excluded given the findings on  recent CT. IMPRESSION: 1. No evidence of bowel obstruction or free intraperitoneal air. 2. New nodular densities within the right mid lung and left upper lobe. Findings could represent infectious or inflammatory nodules. Metastatic disease not excluded given the findings on recent CT. 3. Patchy bibasilar opacities may reflect atelectasis. Electronically Signed   By: Davina Poke M.D.   On: 03/06/2019 17:52   MR ABDOMEN MRCP W WO CONTAST  Result Date: 03/08/2019 CLINICAL DATA:  Abdominal pain with jaundice for 2 months. History of gallstones. EXAM: MRI  ABDOMEN WITHOUT AND WITH CONTRAST (INCLUDING MRCP) TECHNIQUE: Multiplanar multisequence MR imaging of the abdomen was performed both before and after the administration of intravenous contrast. Heavily T2-weighted images of the biliary and pancreatic ducts were obtained, and three-dimensional MRCP images were rendered by post processing. CONTRAST:  70mL GADAVIST GADOBUTROL 1 MMOL/ML IV SOLN COMPARISON:  Abdominopelvic CT 03/06/2019 abdominal ultrasound 03/07/2019 FINDINGS: Despite efforts by the technologist and patient, moderate motion artifact is present on today's exam and could not be eliminated. This reduces exam sensitivity and specificity. Lower chest: There are small bilateral pleural effusions with dependent atelectasis at both lung bases. The heart is moderately enlarged. No significant pericardial effusion. Hepatobiliary: There are probable small hepatic cysts. No enhancing liver lesions are identified. The gallbladder is filled with multiple small gallstones. There is mild intra and extrahepatic biliary dilatation with the common hepatic duct measuring up to 9 mm in diameter. There are multiple small stones within the common bile duct, best seen on series 8. Pancreas: As seen on recent CT, there is heterogeneous enlargement the proximal pancreatic tail with a complex peripherally enhancing fluid collection in the lesser sac, measuring up to 6.6 x  2.7 cm (image 65/17). No obvious pancreatic mass or pancreatic ductal dilatation, although evaluation limited by motion. Spleen: Normal in size without focal abnormality. Adrenals/Urinary Tract: Both adrenal glands appear normal. No significant abnormalities of either kidney. There is no hydronephrosis. Stomach/Bowel: No evidence of bowel wall thickening, distention or surrounding inflammatory change. Vascular/Lymphatic: There are no enlarged abdominal lymph nodes. Aortic atherosclerosis without evidence of aneurysm. Other: Mild generalized soft tissue edema with a small amount of ascites. No other focal extraluminal fluid collections are seen. Musculoskeletal: There is a convex right thoracolumbar scoliosis associated with multiple compression deformities involving the T11, L1 and L3 vertebral bodies, similar to recent CT. Inferior endplate edema and enhancement are present on the left in the L4 and L5 vertebral bodies. There is corresponding sclerosis in the L5 vertebral body on recent CT, but no gross lytic lesion. IMPRESSION: 1. Cholelithiasis with choledocholithiasis and mild biliary dilatation. 2. Persistent heterogeneous enlargement of the proximal pancreatic tail with complex peripherally enhancing fluid collection in the lesser sac, similar to recent CT, and most consistent with pancreatitis. Patient has corresponding elevated serum lipase levels. These pancreatic findings are not well seen by this examination, in part due to motion artifact. CT follow-up recommended to exclude neoplasm. 3. Mild generalized soft tissue edema and small bilateral pleural effusions with bibasilar atelectasis. 4. Grossly stable thoracolumbar compression deformities. Indeterminate inferior endplate edema and enhancement in the L4 and L5 vertebral bodies. Electronically Signed   By: Richardean Sale M.D.   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 Evidence of choledocholithiasis biliary dilatation.  Also with 6.6 x 2.7 cm fluid collection in the lesser sac and heterogeneous enlargement of the proximal pancreatic tail consistent with acute pancreatitis.    ERCP 03/09/19 canceled at our facility as she was felt to be high risk of anesthesiology.  It was recommended that she be transferred to [facility] for ERCP with sphincterotomy and stone extraction but patient declined transfer and procedure.  Per hospitalist, patient's daughter aware of patient's decision and both daughter and Dr. Billie Ruddy believe that patient has decision-making capacity and will proceed according to patient's wishes.  LFTs stable today. Lipase with slight bump. Leukocytosis slightly improved.  She is at risk of cholangitis. She complains of nausea, upper abdominal pain. Diet advanced yesterday per Dr. Roseanne Kaufman request, patient eating very little. If any signs of fever, increase in WBC, worsening symptoms, would consider 10 day course of antibiotics. Discussed with Dr. Gala Romney.   Plan: 1. Agree with palliative consult. Meeting scheduled for today. Appreciate palliative input. Patient is at significant risk for complications of choledocholithiasis ie cholangitis and worsening pancreatitis. I have paged palliative to discuss case.  2. Patient has declined ERCP. 3. Watch for signs of cholangitis. Consider 10 day course of antibiotics if evidence of infection.   Laureen Ochs. Bernarda Caffey [facility] [phone] 12/17/20209:13 AM     LOS: 5 days    Addendum: Discussed with palliative who is meeting with patient and daughter now. Risk of choledocholithiasis discussed.   Laureen Ochs. Bernarda Caffey [facility] [phone] 12/17/202011:16 AM

## 2019-03-11 NOTE — Discharge Summary (Signed)
Brandy Fitzpatrick, is a 85 y.o. female  DOB 07/02/1919  MRN [id number].  Admission date:  03/06/2019  Admitting Physician  Oswald Hillock, MD  Discharge Date:  03/11/2019   Primary MD  Loman Brooklyn, FNP  Recommendations for primary care physician for things to follow:  --Patient and daughter requesting transition to full comfort care and transfer to hospice -Palliative care consult/input appreciated   Admission Diagnosis  Pancreatitis [K85.90] Gallstones [K80.20] Acute pancreatitis, unspecified complication status, unspecified pancreatitis type [K85.90] Elevation of levels of liver transaminase levels [R74.01]   Discharge Diagnosis  Pancreatitis [K85.90] Gallstones [K80.20] Acute pancreatitis, unspecified complication status, unspecified pancreatitis type [K85.90] Elevation of levels of liver transaminase levels [R74.01]    Active Problems:   Pancreatitis   Elevation of levels of liver transaminase levels   Gallstones   Choledocholithiasis   Malignant neoplasm of urinary bladder ([place])   Do not resuscitate   Goals of care, counseling/discussion   Advanced care planning/counseling discussion   Palliative care by specialist   Comfort measures only status      Past Medical History:  Diagnosis Date  . Blind right eye 2017   possible stroke in eye; nerves burned so it would no longer cause pain  . Fracture of unspecified part of neck of left femur, initial encounter for closed fracture ([place]) 05/16/2015  . Renal disorder   . Thyroid disease     Past Surgical History:  Procedure Laterality Date  . JOINT REPLACEMENT  2017   hip       HPI  from the history and physical done on the day of admission:    - Brandy Fitzpatrick  is a 85 y.o. female, with medical history of hypothyroidism, vitamin B12 deficiency who was recently discharged from the hospital after she was treated for gross  hematuria.  Patient was found to have bladder mass, staph aureus UTI.  Plan was to manage bladder cancer conservatively and follow-up with urology.  Foley catheter was placed. Today patient came back to hospital complaint of squeezing abdominal pain.  She had no nausea or vomiting. She denies chest pain, shortness of breath. Denies fever or chills.  In the ED, lab work revealed lipase elevated 1,047 AST 160, ALT 80, alk phos 206, total bilirubin elevated 2.3.  CT abdomen pelvis showed no changes of acute pancreatitis within the body of pancreas with fluid collection noted along the anterior aspect of pancreas as well as diffuse pancreatic edema.  Stable dilation of gallbladder with gallbladder sludge and gallstones.  Slight increase in the degree of intrahepatic and extrahepatic biliary ductal dilatation.    Hospital Course:   Brief Summary:- LennesFarmeris a89 y.o.Caucasian female,with medical history of hypothyroidism, vitamin B12 deficiency who was recently discharged from the hospital after she was treated for gross hematuria. Patient was found to have bladder mass, staph aureus UTI. Plan was to manage bladder cancer conservatively and follow-up with urology. Foley catheter was placed. patient came back to hospital complaint of squeezing abdominal pain. She had no  nausea or vomiting.   A/p    # Acute pancreatitis 2/2 choledocholithiasis, improved --elevated LFTs, elevated bilirubin. CT showed both intra and extrahepatic ductal dilatation. [place] ab showed Cholelithiasis without cholecystitis, and mildly dilated common bile duct at 8 mm.  Lipase much reduced since presentation.  MRCP done and pt has at least 3 stones in her bile duct which measures 16 mm.  PLAN: --Pt refused ERCP ,   --Clear liquid for now and advance as tolerated -Patient and daughter requesting transition to full comfort care and transfer to hospice -Palliative care consult/input appreciated  # Staph aureus  UTI, POA -patient was started on doxycycline at the time of last discharge.  -Completed doxycycline  # Hypothyroidism -Was on Synthroid PTA  # Recent diagnosis of bladder cancer # Hematuria  -conservative management as per note from Dr. Dyann Kief from 03/04/2019. Mild hematuria noted likely due to bladder cancer. --Continue Foley catheter drainage. --Patient and daughter requesting transition to full comfort care and transfer to hospice -Palliative care consult/input appreciated  # Recent right tibial spine avulsion fracture -right x-ray knee showed lateral tibial spine avulsion fracture, nonoperative management, weightbearing as tolerated.   # Mild trop elevation due to demand ischemia --trop 44 and then 46 on presentation.  Not ACS.   Discharge Condition: Hospice house with full comfort care measures  Follow UP  Contact information for after-discharge care    [place] .   Service: Inpatient Hospice Contact information: [address] [place] [phone]              Consults obtained -palliative care  Diet and Activity recommendation:  As advised  Discharge Instructions    Discharge Instructions    Diet general   Complete by: As directed    Discharge instructions   Complete by: As directed    Transfer to hospice house with comfort care measures   Walk with assistance   Complete by: As directed        Discharge Medications     Allergies as of 03/11/2019      Reactions   Codeine Rash   Propoxyphene Rash   Sulfa Antibiotics Rash      Medication List    STOP taking these medications   aspirin 81 MG chewable tablet   digoxin 0.125 MG tablet Commonly known as: LANOXIN   doxycycline 100 MG tablet Commonly known as: VIBRA-TABS   levothyroxine 112 MCG tablet Commonly known as: SYNTHROID   pantoprazole 40 MG tablet Commonly known as: PROTONIX   Systane Balance 0.6 %  Soln Generic drug: Propylene Glycol   triamcinolone cream 0.1 % Commonly known as: KENALOG     TAKE these medications   acetaminophen 325 MG tablet Commonly known as: TYLENOL Take 2 tablets (650 mg total) by mouth every 6 (six) hours as needed for mild pain, fever or headache (or Fever >/= 101).   antiseptic oral rinse Liqd Apply 15 mLs topically as needed for dry mouth.   ondansetron 4 MG tablet Commonly known as: ZOFRAN Take 1 tablet (4 mg total) by mouth every 6 (six) hours as needed for nausea.   polyvinyl alcohol 1.4 % ophthalmic solution Commonly known as: LIQUIFILM TEARS Place 1 drop into both eyes 4 (four) times daily as needed for dry eyes.      Major procedures and Radiology Reports - PLEASE review detailed and final reports for all details, in brief -   CT Chest W  Contrast  Result Date: 03/06/2019 CLINICAL DATA:  Abnormal chest x-ray with findings suspicious for pulmonary nodules EXAM: CT CHEST, ABDOMEN, AND PELVIS WITH CONTRAST TECHNIQUE: Multidetector CT imaging of the chest, abdomen and pelvis was performed following the standard protocol during bolus administration of intravenous contrast. CONTRAST:  118m OMNIPAQUE IOHEXOL 300 MG/ML  SOLN COMPARISON:  Chest x-ray from earlier in the same day, CT from 08/06/2005. FINDINGS: CT CHEST FINDINGS Cardiovascular: Thoracic aorta demonstrates a normal branching pattern. No aneurysmal dilatation or dissection is seen. Atherosclerotic calcifications are noted of a mild degree. Heart is mildly enlarged. Coronary calcifications are seen. The pulmonary artery shows no large central pulmonary embolus. Mediastinum/Nodes: Thoracic inlet is within normal limits. No hilar or mediastinal adenopathy is noted. The esophagus is within normal limits. Lungs/Pleura: The lungs are well aerated bilaterally. There are patchy areas of atelectasis/early infiltrate with small effusions in the lower lobes bilaterally. Multiple scattered pulmonary nodules  are seen in both lungs. The largest of these on the left measures almost 10 mm in greatest dimension. The largest of these on the right measures almost 2 cm with some enhancement identified. These likely represent metastatic disease. No pneumothorax is seen. Musculoskeletal: Degenerative changes of the thoracic spine are noted. Increased kyphosis is seen. No definitive acute rib abnormality is noted. Chronic appearing compression deformities are noted at T4, T5 and T11. CT ABDOMEN PELVIS FINDINGS Hepatobiliary: Liver is well visualized without focal mass. Slight increase in intrahepatic biliary ductal dilatation is noted. The gallbladder is well distended with evidence of gallstones and gallbladder sludge stable from the prior exam. No pericholecystic fluid is noted. Common bile duct is dilated and slightly increased when compared with the prior exam likely related to the underlying pancreatitis. Pancreas: Pancreas demonstrates diffuse mottled enhancement with evidence of a fluid collection measuring 5.6 x 2.1 cm in greatest transverse and AP dimensions respectively along the anterior aspect of the pancreatic body. This was not present on the prior exam and is consistent with acute pancreatitis. Diffuse edema throughout the majority of the pancreas body is noted consistent with the pancreatitis. This corresponds with the significantly elevated lipase on laboratory values. Spleen: Normal in size without focal abnormality. Adrenals/Urinary Tract: Adrenal glands are within normal limits. Kidneys demonstrate a normal enhancement pattern bilaterally. Normal excretion of contrast is seen. The bladder is decompressed by Foley catheter. Previously seen left lateral bladder mass is noted but incompletely evaluated due to the decompression of the bladder. Stomach/Bowel: No obstructive or inflammatory changes of the colon are noted. The appendix is well visualized best seen on the coronal imaging and within normal limits.  Small bowel is within normal limits. No gastric abnormality is seen. Vascular/Lymphatic: Aortic atherosclerosis. No enlarged abdominal or pelvic lymph nodes. Reproductive: Uterus and bilateral adnexa are unremarkable. Other: No significant free pelvic fluid is noted. Mild perihepatic fluid is noted likely related to the underlying pancreatitis. Musculoskeletal: L1 and L3 compression deformities are again seen. Degenerative changes are noted. IMPRESSION: CT of the chest: New bibasilar atelectatic changes with small effusions. Multiple bilateral pulmonary nodules the largest of which lies in the right lower lobe posteriorly as described. Given the patient's known bladder mass disease are likely metastatic in nature. Stable compression deformities are noted. CT of the abdomen and pelvis: New changes of acute pancreatitis within the body of the pancreas with a fluid collection noted along the anterior aspect of the pancreas as well as diffuse pancreatic edema. Some mild free fluid is noted related to these changes as  well. Stable dilatation of the gallbladder with gallbladder sludge and gallstones. Slight increase in the degree of intrahepatic and extrahepatic biliary ductal dilatation. This is likely related to the underlying pancreatitis. Bladder is decompressed although the known left bladder mass is again noted. Electronically Signed   By: Inez Catalina M.D.   On: 03/06/2019 21:06   Abdominal ultrasound  Result Date: 03/07/2019 CLINICAL DATA:  Pancreatitis. EXAM: ABDOMEN ULTRASOUND COMPLETE COMPARISON:  March 06, 2019. FINDINGS: Gallbladder: Cholelithiasis is noted without gallbladder wall thickening or pericholecystic fluid. No sonographic Murphy's sign is noted. Common bile duct: Diameter: 8 mm which is mildly dilated. Liver: No focal lesion identified. Within normal limits in parenchymal echogenicity. Portal vein is patent on color Doppler imaging with normal direction of blood flow towards the liver. IVC:  No abnormality visualized. Pancreas: Pancreatic head appears normal. Body and tail not well visualized due to overlying bowel gas. Spleen: Size and appearance within normal limits. Right Kidney: Length: 11.5 cm. Echogenicity within normal limits. No mass or hydronephrosis visualized. Left Kidney: Length: 10.3 cm. Echogenicity within normal limits. No mass or hydronephrosis visualized. Abdominal aorta: No aneurysm visualized. Other findings: None. IMPRESSION: Cholelithiasis without cholecystitis. Pancreatic head appears normal. Body and tail not well visualized due to overlying bowel gas. Common bile duct is mildly dilated at 8 mm. Correlation with liver function tests is recommended to rule out distal common bile duct obstruction. Electronically Signed   By: Marijo Conception M.D.   On: 03/07/2019 11:46   CT ABDOMEN PELVIS W CONTRAST  Result Date: 03/06/2019 CLINICAL DATA:  Abnormal chest x-ray with findings suspicious for pulmonary nodules EXAM: CT CHEST, ABDOMEN, AND PELVIS WITH CONTRAST TECHNIQUE: Multidetector CT imaging of the chest, abdomen and pelvis was performed following the standard protocol during bolus administration of intravenous contrast. CONTRAST:  18m OMNIPAQUE IOHEXOL 300 MG/ML  SOLN COMPARISON:  Chest x-ray from earlier in the same day, CT from 08/06/2005. FINDINGS: CT CHEST FINDINGS Cardiovascular: Thoracic aorta demonstrates a normal branching pattern. No aneurysmal dilatation or dissection is seen. Atherosclerotic calcifications are noted of a mild degree. Heart is mildly enlarged. Coronary calcifications are seen. The pulmonary artery shows no large central pulmonary embolus. Mediastinum/Nodes: Thoracic inlet is within normal limits. No hilar or mediastinal adenopathy is noted. The esophagus is within normal limits. Lungs/Pleura: The lungs are well aerated bilaterally. There are patchy areas of atelectasis/early infiltrate with small effusions in the lower lobes bilaterally. Multiple  scattered pulmonary nodules are seen in both lungs. The largest of these on the left measures almost 10 mm in greatest dimension. The largest of these on the right measures almost 2 cm with some enhancement identified. These likely represent metastatic disease. No pneumothorax is seen. Musculoskeletal: Degenerative changes of the thoracic spine are noted. Increased kyphosis is seen. No definitive acute rib abnormality is noted. Chronic appearing compression deformities are noted at T4, T5 and T11. CT ABDOMEN PELVIS FINDINGS Hepatobiliary: Liver is well visualized without focal mass. Slight increase in intrahepatic biliary ductal dilatation is noted. The gallbladder is well distended with evidence of gallstones and gallbladder sludge stable from the prior exam. No pericholecystic fluid is noted. Common bile duct is dilated and slightly increased when compared with the prior exam likely related to the underlying pancreatitis. Pancreas: Pancreas demonstrates diffuse mottled enhancement with evidence of a fluid collection measuring 5.6 x 2.1 cm in greatest transverse and AP dimensions respectively along the anterior aspect of the pancreatic body. This was not present on the prior exam and  is consistent with acute pancreatitis. Diffuse edema throughout the majority of the pancreas body is noted consistent with the pancreatitis. This corresponds with the significantly elevated lipase on laboratory values. Spleen: Normal in size without focal abnormality. Adrenals/Urinary Tract: Adrenal glands are within normal limits. Kidneys demonstrate a normal enhancement pattern bilaterally. Normal excretion of contrast is seen. The bladder is decompressed by Foley catheter. Previously seen left lateral bladder mass is noted but incompletely evaluated due to the decompression of the bladder. Stomach/Bowel: No obstructive or inflammatory changes of the colon are noted. The appendix is well visualized best seen on the coronal imaging  and within normal limits. Small bowel is within normal limits. No gastric abnormality is seen. Vascular/Lymphatic: Aortic atherosclerosis. No enlarged abdominal or pelvic lymph nodes. Reproductive: Uterus and bilateral adnexa are unremarkable. Other: No significant free pelvic fluid is noted. Mild perihepatic fluid is noted likely related to the underlying pancreatitis. Musculoskeletal: L1 and L3 compression deformities are again seen. Degenerative changes are noted. IMPRESSION: CT of the chest: New bibasilar atelectatic changes with small effusions. Multiple bilateral pulmonary nodules the largest of which lies in the right lower lobe posteriorly as described. Given the patient's known bladder mass disease are likely metastatic in nature. Stable compression deformities are noted. CT of the abdomen and pelvis: New changes of acute pancreatitis within the body of the pancreas with a fluid collection noted along the anterior aspect of the pancreas as well as diffuse pancreatic edema. Some mild free fluid is noted related to these changes as well. Stable dilatation of the gallbladder with gallbladder sludge and gallstones. Slight increase in the degree of intrahepatic and extrahepatic biliary ductal dilatation. This is likely related to the underlying pancreatitis. Bladder is decompressed although the known left bladder mass is again noted. Electronically Signed   By: Inez Catalina M.D.   On: 03/06/2019 21:06   CT ABDOMEN PELVIS W CONTRAST  Result Date: 03/01/2019 CLINICAL DATA:  Hematuria, abdominal distension EXAM: CT ABDOMEN AND PELVIS WITH CONTRAST TECHNIQUE: Multidetector CT imaging of the abdomen and pelvis was performed using the standard protocol following bolus administration of intravenous contrast. CONTRAST:  8m OMNIPAQUE IOHEXOL 300 MG/ML  SOLN COMPARISON:  None. FINDINGS: Motion degraded images. Lower chest: Emphysematous changes with bronchiectasis in the right middle lobe, lingula, and bilateral  lower lobes. 8 mm medial right lower lobe nodule (series 4/image 4), grossly unchanged from 2007, benign. Hepatobiliary: Liver is within normal limits. Multiple gallstones, without associated inflammatory changes. No intrahepatic or extrahepatic ductal dilatation. Pancreas: Within normal limits. Spleen: Within normal limits. Adrenals/Urinary Tract: Adrenal glands are within normal limits. Kidneys are grossly unremarkable.  No hydronephrosis. Large left inferolateral bladder mass measuring at least 6.4 x 3.9 cm (series 2/image 70), suspicious for primary bladder neoplasm. Additional 1.2 x 2.8 cm lesion along the right anterior bladder (series 2/image 69), also worrisome for tumor. Stomach/Bowel: Stomach is within normal limits. No evidence of bowel obstruction. Appendix is not discretely visualized. Vascular/Lymphatic: No evidence of abdominal aortic aneurysm. Atherosclerotic calcifications of the abdominal aorta and branch vessels. No suspicious abdominopelvic lymphadenopathy. Reproductive: Uterus is within normal limits. No adnexal masses, noting streak artifact obscures the left pelvis. Other: No abdominopelvic ascites. Musculoskeletal: Moderate to severe compression fracture deformities at T11 and L1 (sagittal image 41), unchanged from 2015. Mild superior endplate compression fracture deformity at L3, also unchanged. No retropulsion. Left hip arthroplasty, without evidence of complication. IMPRESSION: Motion degraded images. Multifocal bladder masses, suspicious for primary bladder neoplasm. Urology consultation is suggested for  cystoscopy if tissue diagnosis is desired. No findings specific for synchronous or metastatic disease. 8 mm medial right lower lobe nodule, grossly unchanged from 2007, benign. Cholelithiasis, without associated inflammatory changes. Electronically Signed   By: Julian Hy M.D.   On: 03/01/2019 17:18   MR 3D Recon At Scanner  Result Date: 03/08/2019 CLINICAL DATA:  Abdominal  pain with jaundice for 2 months. History of gallstones. EXAM: MRI ABDOMEN WITHOUT AND WITH CONTRAST (INCLUDING MRCP) TECHNIQUE: Multiplanar multisequence MR imaging of the abdomen was performed both before and after the administration of intravenous contrast. Heavily T2-weighted images of the biliary and pancreatic ducts were obtained, and three-dimensional MRCP images were rendered by post processing. CONTRAST:  93m GADAVIST GADOBUTROL 1 MMOL/ML IV SOLN COMPARISON:  Abdominopelvic CT 03/06/2019 abdominal ultrasound 03/07/2019 FINDINGS: Despite efforts by the technologist and patient, moderate motion artifact is present on today's exam and could not be eliminated. This reduces exam sensitivity and specificity. Lower chest: There are small bilateral pleural effusions with dependent atelectasis at both lung bases. The heart is moderately enlarged. No significant pericardial effusion. Hepatobiliary: There are probable small hepatic cysts. No enhancing liver lesions are identified. The gallbladder is filled with multiple small gallstones. There is mild intra and extrahepatic biliary dilatation with the common hepatic duct measuring up to 9 mm in diameter. There are multiple small stones within the common bile duct, best seen on series 8. Pancreas: As seen on recent CT, there is heterogeneous enlargement the proximal pancreatic tail with a complex peripherally enhancing fluid collection in the lesser sac, measuring up to 6.6 x 2.7 cm (image 65/17). No obvious pancreatic mass or pancreatic ductal dilatation, although evaluation limited by motion. Spleen: Normal in size without focal abnormality. Adrenals/Urinary Tract: Both adrenal glands appear normal. No significant abnormalities of either kidney. There is no hydronephrosis. Stomach/Bowel: No evidence of bowel wall thickening, distention or surrounding inflammatory change. Vascular/Lymphatic: There are no enlarged abdominal lymph nodes. Aortic atherosclerosis without  evidence of aneurysm. Other: Mild generalized soft tissue edema with a small amount of ascites. No other focal extraluminal fluid collections are seen. Musculoskeletal: There is a convex right thoracolumbar scoliosis associated with multiple compression deformities involving the T11, L1 and L3 vertebral bodies, similar to recent CT. Inferior endplate edema and enhancement are present on the left in the L4 and L5 vertebral bodies. There is corresponding sclerosis in the L5 vertebral body on recent CT, but no gross lytic lesion. IMPRESSION: 1. Cholelithiasis with choledocholithiasis and mild biliary dilatation. 2. Persistent heterogeneous enlargement of the proximal pancreatic tail with complex peripherally enhancing fluid collection in the lesser sac, similar to recent CT, and most consistent with pancreatitis. Patient has corresponding elevated serum lipase levels. These pancreatic findings are not well seen by this examination, in part due to motion artifact. CT follow-up recommended to exclude neoplasm. 3. Mild generalized soft tissue edema and small bilateral pleural effusions with bibasilar atelectasis. 4. Grossly stable thoracolumbar compression deformities. Indeterminate inferior endplate edema and enhancement in the L4 and L5 vertebral bodies. Electronically Signed   By: WRichardean SaleM.D.   On: 03/08/2019 15:33   DG Knee Complete 4 Views Right  Result Date: 03/01/2019 CLINICAL DATA:  Fall 6 weeks prior with pain in right knee EXAM: RIGHT KNEE - COMPLETE 4+ VIEW COMPARISON:  None. FINDINGS: The osseous structures appear diffusely demineralized which may limit detection of small or nondisplaced fractures. There is a questionable avulsion type fracture of the lateral tibial spine. Mild tricompartmental degenerative changes are present.  Medial and lateral meniscal chondrocalcinosis is noted. No sizeable joint effusion. Atherosclerotic calcifications noted posterior to the knee. IMPRESSION: 1. Questionable  avulsion fracture of the lateral tibial spine. 2. Diffuse osseous demineralization. 3. Tricompartmental degenerative changes with medial and lateral meniscal chondrocalcinosis may reflect underlying calcium pyrophosphate deposition. Electronically Signed   By: Lovena Le M.D.   On: 03/01/2019 17:17   Acute Abd Series  Result Date: 03/06/2019 CLINICAL DATA:  Hematuria, abdominal pain EXAM: DG ABDOMEN ACUTE W/ 1V CHEST COMPARISON:  CT 03/01/2019 FINDINGS: There is no evidence of dilated bowel loops or free intraperitoneal air. No radiopaque calculi or other significant radiographic abnormality is seen. Lung volumes are low. Patchy bibasilar opacities may reflect atelectasis. There are more nodular densities within the right mid lung and left upper lobe, new from prior which could represent foci of infection. Metastatic disease not excluded given the findings on recent CT. IMPRESSION: 1. No evidence of bowel obstruction or free intraperitoneal air. 2. New nodular densities within the right mid lung and left upper lobe. Findings could represent infectious or inflammatory nodules. Metastatic disease not excluded given the findings on recent CT. 3. Patchy bibasilar opacities may reflect atelectasis. Electronically Signed   By: Davina Poke M.D.   On: 03/06/2019 17:52   MR ABDOMEN MRCP W WO CONTAST  Result Date: 03/08/2019 CLINICAL DATA:  Abdominal pain with jaundice for 2 months. History of gallstones. EXAM: MRI ABDOMEN WITHOUT AND WITH CONTRAST (INCLUDING MRCP) TECHNIQUE: Multiplanar multisequence MR imaging of the abdomen was performed both before and after the administration of intravenous contrast. Heavily T2-weighted images of the biliary and pancreatic ducts were obtained, and three-dimensional MRCP images were rendered by post processing. CONTRAST:  72m GADAVIST GADOBUTROL 1 MMOL/ML IV SOLN COMPARISON:  Abdominopelvic CT 03/06/2019 abdominal ultrasound 03/07/2019 FINDINGS: Despite efforts by the  technologist and patient, moderate motion artifact is present on today's exam and could not be eliminated. This reduces exam sensitivity and specificity. Lower chest: There are small bilateral pleural effusions with dependent atelectasis at both lung bases. The heart is moderately enlarged. No significant pericardial effusion. Hepatobiliary: There are probable small hepatic cysts. No enhancing liver lesions are identified. The gallbladder is filled with multiple small gallstones. There is mild intra and extrahepatic biliary dilatation with the common hepatic duct measuring up to 9 mm in diameter. There are multiple small stones within the common bile duct, best seen on series 8. Pancreas: As seen on recent CT, there is heterogeneous enlargement the proximal pancreatic tail with a complex peripherally enhancing fluid collection in the lesser sac, measuring up to 6.6 x 2.7 cm (image 65/17). No obvious pancreatic mass or pancreatic ductal dilatation, although evaluation limited by motion. Spleen: Normal in size without focal abnormality. Adrenals/Urinary Tract: Both adrenal glands appear normal. No significant abnormalities of either kidney. There is no hydronephrosis. Stomach/Bowel: No evidence of bowel wall thickening, distention or surrounding inflammatory change. Vascular/Lymphatic: There are no enlarged abdominal lymph nodes. Aortic atherosclerosis without evidence of aneurysm. Other: Mild generalized soft tissue edema with a small amount of ascites. No other focal extraluminal fluid collections are seen. Musculoskeletal: There is a convex right thoracolumbar scoliosis associated with multiple compression deformities involving the T11, L1 and L3 vertebral bodies, similar to recent CT. Inferior endplate edema and enhancement are present on the left in the L4 and L5 vertebral bodies. There is corresponding sclerosis in the L5 vertebral body on recent CT, but no gross lytic lesion. IMPRESSION: 1. Cholelithiasis with  choledocholithiasis and mild biliary  dilatation. 2. Persistent heterogeneous enlargement of the proximal pancreatic tail with complex peripherally enhancing fluid collection in the lesser sac, similar to recent CT, and most consistent with pancreatitis. Patient has corresponding elevated serum lipase levels. These pancreatic findings are not well seen by this examination, in part due to motion artifact. CT follow-up recommended to exclude neoplasm. 3. Mild generalized soft tissue edema and small bilateral pleural effusions with bibasilar atelectasis. 4. Grossly stable thoracolumbar compression deformities. Indeterminate inferior endplate edema and enhancement in the L4 and L5 vertebral bodies. Electronically Signed   By: Richardean Sale M.D.   On: 03/08/2019 15:33   Micro Results   Recent Results (from the past 240 hour(s))  SARS CORONAVIRUS 2 (TAT 6-24 HRS) Nasopharyngeal Nasopharyngeal Swab     Status: None   Collection Time: 03/01/19 10:41 PM   Specimen: Nasopharyngeal Swab  Result Value Ref Range Status   SARS Coronavirus 2 NEGATIVE NEGATIVE Final    Comment: (NOTE) SARS-CoV-2 target nucleic acids are NOT DETECTED. The SARS-CoV-2 RNA is generally detectable in upper and lower respiratory specimens during the acute phase of infection. Negative results do not preclude SARS-CoV-2 infection, do not rule out co-infections with other pathogens, and should not be used as the sole basis for treatment or other patient management decisions. Negative results must be combined with clinical observations, patient history, and epidemiological information. The expected result is Negative. Fact Sheet for Patients: SugarRoll.be Fact Sheet for Healthcare Providers: https://www.woods-mathews.com/ This test is not yet approved or cleared by the [place] FDA and  has been authorized for detection and/or diagnosis of SARS-CoV-2 by FDA under an Emergency Use  Authorization (EUA). This EUA will remain  in effect (meaning this test can be used) for the duration of the COVID-19 declaration under Section 56 4(b)(1) of the Act, 21 U.S.C. section 360bbb-3(b)(1), unless the authorization is terminated or revoked sooner. Performed at [facility], [place] [address] [place], [place] [address]   SARS CORONAVIRUS 2 (TAT 6-24 HRS) Nasopharyngeal Nasopharyngeal Swab     Status: None   Collection Time: 03/06/19  6:32 PM   Specimen: Nasopharyngeal Swab  Result Value Ref Range Status   SARS Coronavirus 2 NEGATIVE NEGATIVE Final    Comment: (NOTE) SARS-CoV-2 target nucleic acids are NOT DETECTED. The SARS-CoV-2 RNA is generally detectable in upper and lower respiratory specimens during the acute phase of infection. Negative results do not preclude SARS-CoV-2 infection, do not rule out co-infections with other pathogens, and should not be used as the sole basis for treatment or other patient management decisions. Negative results must be combined with clinical observations, patient history, and epidemiological information. The expected result is Negative. Fact Sheet for Patients: SugarRoll.be Fact Sheet for Healthcare Providers: https://www.woods-mathews.com/ This test is not yet approved or cleared by the [place] FDA and  has been authorized for detection and/or diagnosis of SARS-CoV-2 by FDA under an Emergency Use Authorization (EUA). This EUA will remain  in effect (meaning this test can be used) for the duration of the COVID-19 declaration under Section 56 4(b)(1) of the Act, 21 U.S.C. section 360bbb-3(b)(1), unless the authorization is terminated or revoked sooner. Performed at [facility], [place] [address], [place], [place] [address]        Today   Subjective   Brandy Fitzpatrick see today---nausea and abdominal pain persist  -Patient and daughter requesting transition to full comfort  care and transfer to hospice -Palliative care consult/input appreciated  Patient has been seen and examined prior to discharge   Objective   Blood pressure (!) 121/58, pulse (!) 103, temperature 97.8 F (36.6 C), temperature source Oral, resp. rate 20, height 5' 3" (1.6 m), weight 54.2 kg, SpO2 91 %.   Intake/Output Summary (Last 24 hours) at 03/11/2019 1616 Last data filed at 03/11/2019 1405 Gross per 24 hour  Intake 320 ml  Output 1625 ml  Net -1305 ml    Exam Gen:- Awake Alert, no acute distress  HEENT:- [place].AT,   Ears- Very HOH Neck-Supple Neck,No JVD,.  Lungs-  CTAB , good air movement bilaterally  CV- S1, S2 normal, regular Abd-  +ve B.Sounds, Abd Soft, epigastric and right upper quadrant tenderness  extremity/Skin:- No  edema,   good pulses Psych-affect is appropriate, oriented x3 Neuro-no new focal deficits, no tremors    Data Review   CBC w Diff:  Lab Results  Component Value Date   WBC 14.0 (H) 03/11/2019   HGB 10.3 (L) 03/11/2019   HGB 11.5 02/09/2019   HCT 32.9 (L) 03/11/2019   HCT 35.4 02/09/2019   PLT 176 03/11/2019   PLT 212 02/09/2019   LYMPHOPCT 8 03/09/2019   MONOPCT 7 03/09/2019   EOSPCT 1 03/09/2019   BASOPCT 1 03/09/2019    CMP:  Lab Results  Component Value Date   NA 135 03/11/2019   NA 140 02/09/2019   K 3.1 (L) 03/11/2019   CL 101 03/11/2019   CO2 25 03/11/2019   BUN 14 03/11/2019   BUN 18 02/09/2019   CREATININE 0.63 03/11/2019   PROT 5.1 (L) 03/11/2019   PROT 6.9 02/09/2019   ALBUMIN 2.0 (L) 03/11/2019   ALBUMIN 3.6 02/09/2019   BILITOT 1.8 (H) 03/11/2019   BILITOT 0.6 02/09/2019   ALKPHOS 176 (H) 03/11/2019   AST 31 03/11/2019   ALT 33 03/11/2019    Total Discharge time is about 33 minutes  Roxan Hockey M.D on 03/11/2019 at 4:16 PM  Go to [email or web address] -  for contact info  [facility] - Office  [phone]

## 2019-03-11 NOTE — Discharge Instructions (Signed)
Transfer to hospice house with comfort care measures

## 2019-03-11 NOTE — Progress Notes (Signed)
Patient discharged with [facility] to transport to [facility]. Vitals obtained. Daughter, Marlowe Kays updated.

## 2019-03-11 NOTE — Progress Notes (Signed)
PROGRESS NOTE    Brandy Fitzpatrick  [id number] DOB: 27-Apr-1919 DOA: 03/06/2019 PCP: Loman Brooklyn, FNP    Assessment & Plan:   Active Problems:   Pancreatitis   Elevation of levels of liver transaminase levels   Gallstones   Choledocholithiasis   Brief Summary:- Brandy Fitzpatrick  is a 85 y.o. Caucasian female, with medical history of hypothyroidism, vitamin B12 deficiency who was recently discharged from the hospital after she was treated for gross hematuria.  Patient was found to have bladder mass, staph aureus UTI.  Plan was to manage bladder cancer conservatively and follow-up with urology.  Foley catheter was placed. Today patient came back to hospital complaint of squeezing abdominal pain.  She had no nausea or vomiting.   A/p # Acute pancreatitis 2/2 choledocholithiasis, improved --elevated LFTs, elevated bilirubin.  CT showed both intra and extrahepatic ductal dilatation.  [place] ab showed Cholelithiasis without cholecystitis, and mildly dilated common bile duct at 8 mm.  Lipase much reduced since presentation.  MRCP done and pt has at least 3 stones in her bile duct which measures 16 mm.   PLAN: --Pt refused ERCP    --Clear liquid for now and advance as tolerated --continue MIVF  # Staph aureus UTI, POA -patient was started on doxycycline at the time of last discharge.   PLAN: --Completed IV doxy on 12/16  # Hypothyroidism --patient takes 112 mcg Synthroid at home.   --resumed home Synthroid   # Recent diagnosis of bladder cancer # Hematuria  -conservative management  -  Mild hematuria noted likely due to bladder cancer. --Continue Foley catheter drainage. --Palliative consult appreciated, patient and daughter are leaning towards hospice, yet to make final decisions, awaiting further conversations with palliative care team  # Recent right tibial spine avulsion fracture -right x-ray knee showed lateral tibial spine avulsion fracture, nonoperative management,  weightbearing as tolerated.  # Digoxin use -patient was on digoxin with no clear reason for being on digoxin.    Digoxin discontinued  # Mild trop elevation due to demand ischemia --trop 44 and then 46 on presentation.  Not ACS.   DVT prophylaxis: SCD/Compression stockings Code Status: DNR  Family Communication: Updated daughter at bedside. Disposition Plan: Palliative consult appreciated, patient and daughter are leaning towards hospice, yet to make final decisions, awaiting further conversations with palliative care team--- possible transfer to hospice facility.    Subjective and Interval History:    Nausea and abdominal discomfort with oral intake    Objective: Vitals:   03/10/19 1510 03/10/19 1948 03/10/19 2300 03/11/19 0530  BP:   119/75 130/72  Pulse:   83 79  Resp:   20 18  Temp:   99.3 F (37.4 C) 97.9 F (36.6 C)  TempSrc:   Oral Oral  SpO2: 94% 92% 94% 90%  Weight:      Height:        Intake/Output Summary (Last 24 hours) at 03/11/2019 0831 Last data filed at 03/11/2019 0741 Gross per 24 hour  Intake 2658.95 ml  Output 1450 ml  Net 1208.95 ml   Filed Weights   03/06/19 1615 03/07/19 1653 03/09/19 0635  Weight: 48.2 kg 53.1 kg 54.2 kg    Examination:   Constitutional: NAD, AAOx3 HEENT: conjunctivae and lids normal, EOMI, extremely hard of hearing CV: RRR no M,R,G. Distal pulses +2.  No cyanosis.   RESP: CTA B/L, normal respiratory effort  GI: +BS, ND, soft Extremities: No effusions, edema, or tenderness in BLE SKIN: warm, dry and  intact Neuro: II - XII grossly intact.  Sensation intact Psych: Normal mood and affect.  Appropriate judgement and reason   Data Reviewed: I have personally reviewed following labs and imaging studies  CBC: Recent Labs  Lab 03/06/19 1649 03/07/19 0410 03/09/19 0552 03/10/19 0106 03/11/19 0607  WBC 7.4 7.5 12.6* 15.5* 14.0*  NEUTROABS 6.2  --  10.4*  --   --   HGB 10.5* 9.6* 10.7* 11.2* 10.3*  HCT 34.1* 31.1*  33.8* 35.2* 32.9*  MCV 86.8 86.9 85.6 84.6 85.0  PLT 159 140* 180 180 0000000   Basic Metabolic Panel: Recent Labs  Lab 03/06/19 1649 03/07/19 0410 03/09/19 0552 03/10/19 0106 03/11/19 0607  NA 134* 136 133* 131* 135  K 3.5 3.5 3.7 3.5 3.1*  CL 104 108 102 102 101  CO2 24 24 23  21* 25  GLUCOSE 132* 108* 82 113* 104*  BUN 17 14 13 12 14   CREATININE 0.62 0.51 0.62 0.61 0.63  CALCIUM 9.1 9.0 8.8* 8.7* 8.6*  MG  --   --   --  1.5*  --   PHOS  --   --   --  2.0*  --    GFR: Estimated Creatinine Clearance: 34 mL/min (by C-G formula based on SCr of 0.63 mg/dL). Liver Function Tests: Recent Labs  Lab 03/07/19 0410 03/08/19 0606 03/09/19 0552 03/10/19 0106 03/11/19 0607  AST 152* 84* 43* 41 31  ALT 87* 70* 50* 44 33  ALKPHOS 182* 171* 176* 198* 176*  BILITOT 3.1* 2.0* 1.8* 1.9* 1.8*  PROT 5.2* 5.3* 5.3* 5.6* 5.1*  ALBUMIN 2.3* 2.3* 2.2* 2.3* 2.0*   Recent Labs  Lab 03/06/19 1649 03/07/19 0410 03/09/19 0552 03/11/19 0607  LIPASE 1,047* 316* 143* 369*   No results for input(s): AMMONIA in the last 168 hours. Coagulation Profile: No results for input(s): INR, PROTIME in the last 168 hours. Cardiac Enzymes: No results for input(s): CKTOTAL, CKMB, CKMBINDEX, TROPONINI in the last 168 hours. BNP (last 3 results) No results for input(s): PROBNP in the last 8760 hours. HbA1C: No results for input(s): HGBA1C in the last 72 hours. CBG: Recent Labs  Lab 03/04/19 1146  GLUCAP 89   Lipid Profile: No results for input(s): CHOL, HDL, LDLCALC, TRIG, CHOLHDL, LDLDIRECT in the last 72 hours. Thyroid Function Tests: No results for input(s): TSH, T4TOTAL, FREET4, T3FREE, THYROIDAB in the last 72 hours. Anemia Panel: No results for input(s): VITAMINB12, FOLATE, FERRITIN, TIBC, IRON, RETICCTPCT in the last 72 hours. Sepsis Labs: No results for input(s): PROCALCITON, LATICACIDVEN in the last 168 hours.  Recent Results (from the past 240 hour(s))  Urine culture     Status:  Abnormal   Collection Time: 03/01/19  1:09 PM   Specimen: Urine, Random  Result Value Ref Range Status   Specimen Description   Final    URINE, RANDOM Performed at [facility], [address] [place], [place] [address]    Special Requests   Final    NONE Performed at [facility], [address] [place], [place] [address]    Culture (A)  Final    10,000 COLONIES/mL METHICILLIN RESISTANT STAPHYLOCOCCUS AUREUS   Report Status 03/04/2019 FINAL  Final   Organism ID, Bacteria METHICILLIN RESISTANT STAPHYLOCOCCUS AUREUS (A)  Final      Susceptibility   Methicillin resistant staphylococcus aureus - MIC*    CIPROFLOXACIN >=8 RESISTANT Resistant     GENTAMICIN <=0.5 SENSITIVE Sensitive     NITROFURANTOIN <=16 SENSITIVE Sensitive  OXACILLIN >=4 RESISTANT Resistant     TETRACYCLINE <=1 SENSITIVE Sensitive     VANCOMYCIN 1 SENSITIVE Sensitive     TRIMETH/SULFA <=10 SENSITIVE Sensitive     CLINDAMYCIN <=0.25 SENSITIVE Sensitive     RIFAMPIN <=0.5 SENSITIVE Sensitive     Inducible Clindamycin NEGATIVE Sensitive     * 10,000 COLONIES/mL METHICILLIN RESISTANT STAPHYLOCOCCUS AUREUS  SARS CORONAVIRUS 2 (TAT 6-24 HRS) Nasopharyngeal Nasopharyngeal Swab     Status: None   Collection Time: 03/01/19 10:41 PM   Specimen: Nasopharyngeal Swab  Result Value Ref Range Status   SARS Coronavirus 2 NEGATIVE NEGATIVE Final    Comment: (NOTE) SARS-CoV-2 target nucleic acids are NOT DETECTED. The SARS-CoV-2 RNA is generally detectable in upper and lower respiratory specimens during the acute phase of infection. Negative results do not preclude SARS-CoV-2 infection, do not rule out co-infections with other pathogens, and should not be used as the sole basis for treatment or other patient management decisions. Negative results must be combined with clinical observations, patient history, and epidemiological information. The expected result is Negative. Fact Sheet for  Patients: SugarRoll.be Fact Sheet for Healthcare Providers: https://www.woods-mathews.com/ This test is not yet approved or cleared by the [place] FDA and  has been authorized for detection and/or diagnosis of SARS-CoV-2 by FDA under an Emergency Use Authorization (EUA). This EUA will remain  in effect (meaning this test can be used) for the duration of the COVID-19 declaration under Section 56 4(b)(1) of the Act, 21 U.S.C. section 360bbb-3(b)(1), unless the authorization is terminated or revoked sooner. Performed at [facility], [place] [address] [place], [place] [address]   SARS CORONAVIRUS 2 (TAT 6-24 HRS) Nasopharyngeal Nasopharyngeal Swab     Status: None   Collection Time: 03/06/19  6:32 PM   Specimen: Nasopharyngeal Swab  Result Value Ref Range Status   SARS Coronavirus 2 NEGATIVE NEGATIVE Final    Comment: (NOTE) SARS-CoV-2 target nucleic acids are NOT DETECTED. The SARS-CoV-2 RNA is generally detectable in upper and lower respiratory specimens during the acute phase of infection. Negative results do not preclude SARS-CoV-2 infection, do not rule out co-infections with other pathogens, and should not be used as the sole basis for treatment or other patient management decisions. Negative results must be combined with clinical observations, patient history, and epidemiological information. The expected result is Negative. Fact Sheet for Patients: SugarRoll.be Fact Sheet for Healthcare Providers: https://www.woods-mathews.com/ This test is not yet approved or cleared by the [place] FDA and  has been authorized for detection and/or diagnosis of SARS-CoV-2 by FDA under an Emergency Use Authorization (EUA). This EUA will remain  in effect (meaning this test can be used) for the duration of the COVID-19 declaration under Section 56 4(b)(1) of the Act, 21 U.S.C. section  360bbb-3(b)(1), unless the authorization is terminated or revoked sooner. Performed at [facility], [place] [address], [place], [place] [address]       Radiology Studies: No results found.   Scheduled Meds: . Chlorhexidine Gluconate Cloth  6 each Topical Daily  . levothyroxine  112 mcg Oral Q0600  . pantoprazole (PROTONIX) IV  40 mg Intravenous Q24H  . phosphorus  500 mg Oral TID  . polyethylene glycol  34 g Oral TID  . potassium chloride  40 mEq Oral Q3H   Continuous Infusions: . sodium chloride    . ampicillin-sulbactam (UNASYN) 1.5 g IVPB (Mini-Bag Plus)    . doxycycline (VIBRAMYCIN) IV 100 mg (03/10/19 2147)  . lactated ringers  75 mL/hr at 03/09/19 2337     LOS: 5 days    Roxan Hockey, MD [facility] If 7PM-7AM, please contact night-coverage 03/11/2019, 8:31 AM

## 2019-03-16 ENCOUNTER — Telehealth: Payer: Self-pay | Admitting: Family Medicine

## 2019-03-16 NOTE — Telephone Encounter (Signed)
Family wanted to make you aware.

## 2019-03-16 NOTE — Telephone Encounter (Signed)
Thank you. We had reached out earlier as I saw she was discharged and in need of a referral to Hospice. I didn't want her to have to wait until next week.

## 2019-03-23 ENCOUNTER — Ambulatory Visit: Payer: Medicare Other | Admitting: Family Medicine

## 2019-03-26 DEATH — deceased

## 2019-05-14 ENCOUNTER — Ambulatory Visit: Payer: Medicare Other | Admitting: Family Medicine

## 2021-04-25 IMAGING — MR MR 3D RECON AT [facility]
15 of 19 series · 15 of 19 positions shown · IV contrast (5ml gadavist)
Comparison: Abdominopelvic CT 03/06/2019 abdominal ultrasound
03/07/2019

CLINICAL DATA: Abdominal pain with jaundice for 2 months. History
of gallstones.



[Series 2: GRE · coronal · 5.0mm · 1.03mm/px · 1 of 36 slices shown]
[im 1/36]
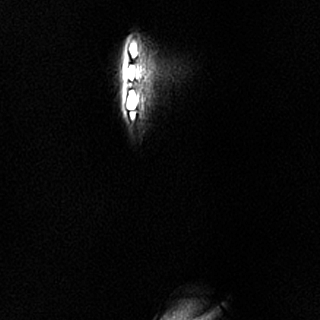

[Series 3: T2 · axial · 5.0mm · 0.98mm/px · 1 of 40 slices shown (1 of 2)]
[im 1/40]
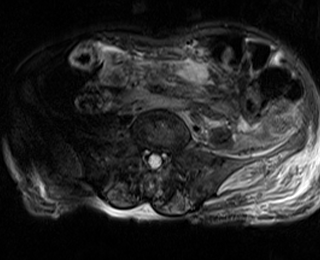

[Series 5: ax dual echo_opp · axial · 4.0mm · 0.52mm/px · 1 of 64 slices shown]
[im 1/64]
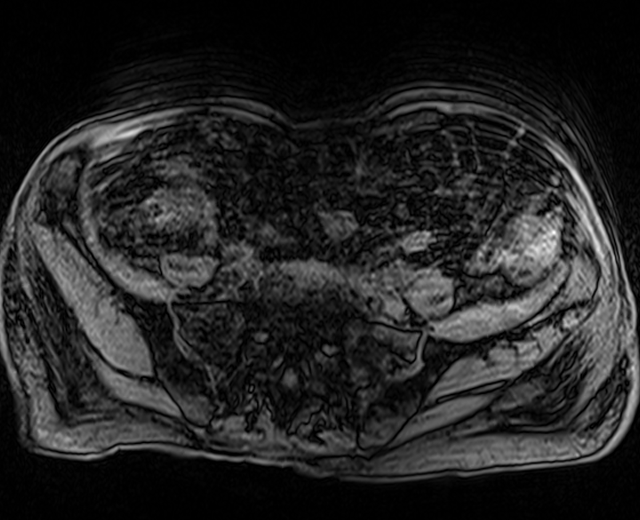

[Series 8: MRCP · coronal · 1.2mm · 0.42mm/px · 1 of 120 slices shown]
[im 1/120]
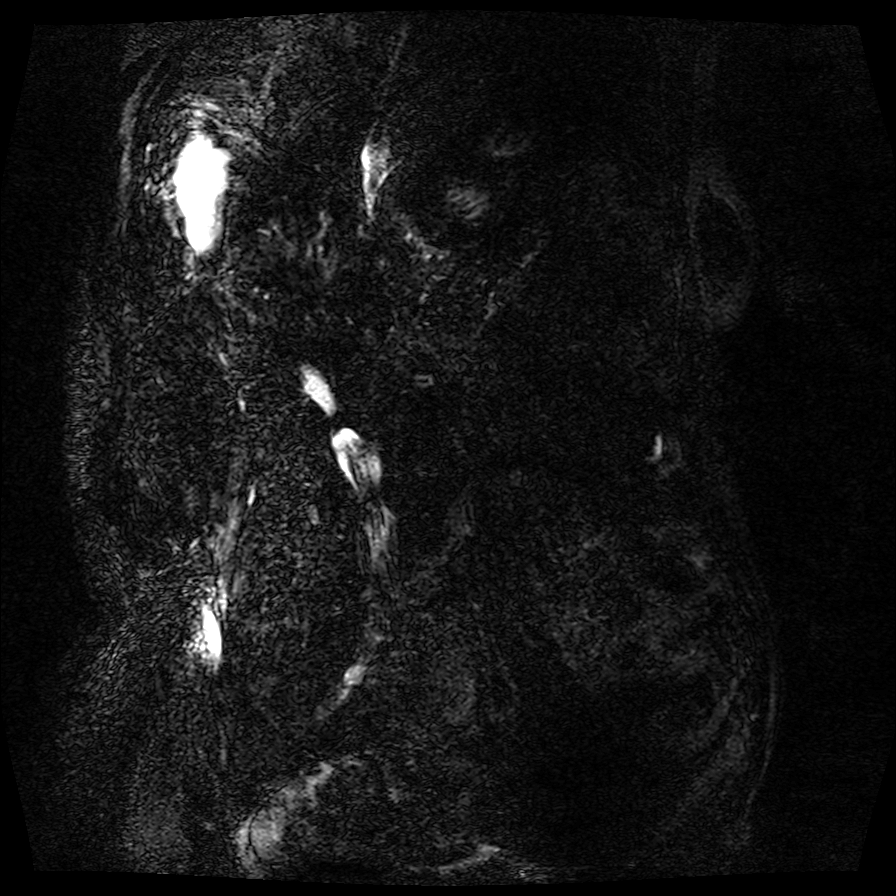

[Series 11: DWI · axial · 5.0mm · 0.86mm/px · 1 of 90 slices shown]
[im 1/90]
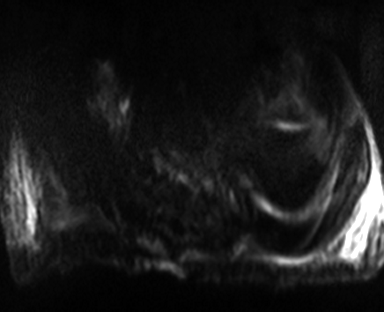

[Series 12: ax dwi_adc · axial · 5.0mm · 0.86mm/px · 1 of 45 slices shown]
[im 1/45]
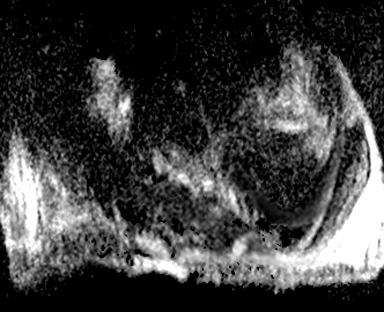

[Series 14: T1 fat-sat · axial · 3.7mm · 0.55mm/px · 1 of 80 slices shown (1 of 4)]
[im 1/80]
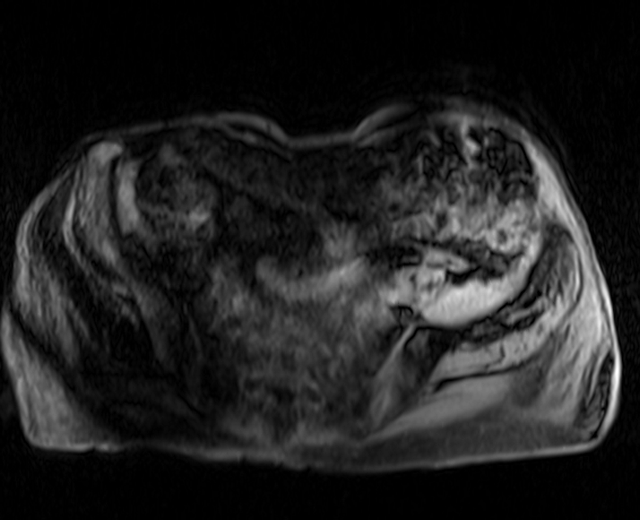

[Series 15: T1 · axial · 3.7mm · 0.55mm/px · 1 of 80 slices shown (1 of 3)]
[im 1/80]
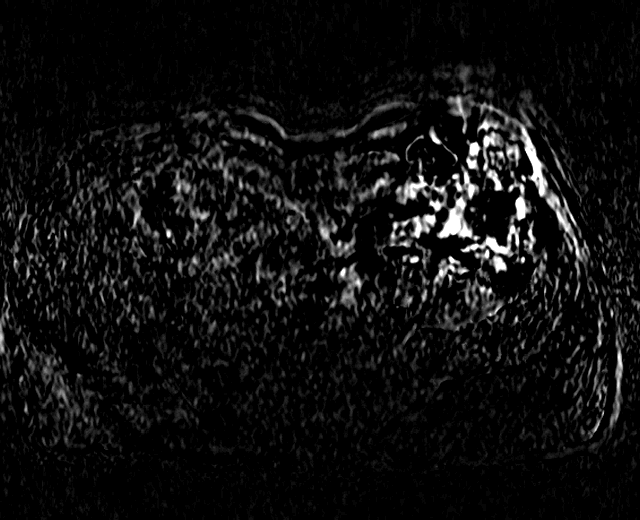

[Series 16: T1 fat-sat · axial · 3.7mm · 0.55mm/px · 1 of 80 slices shown (2 of 4)]
[im 1/80]
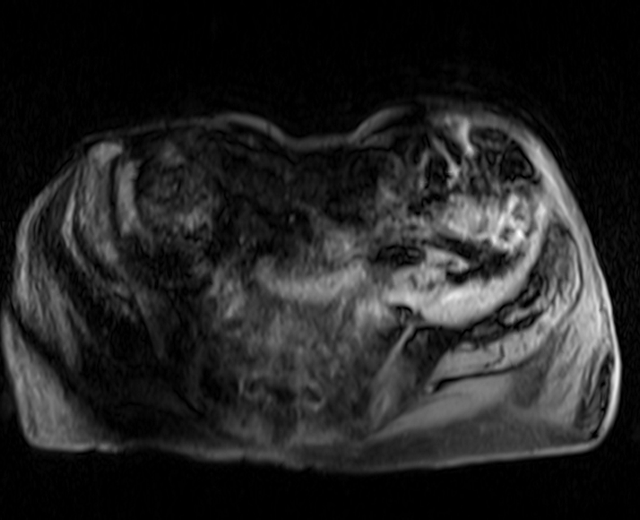

[Series 18: T1 fat-sat · axial · 3.7mm · 0.55mm/px · 1 of 80 slices shown (3 of 4)]
[im 1/80]
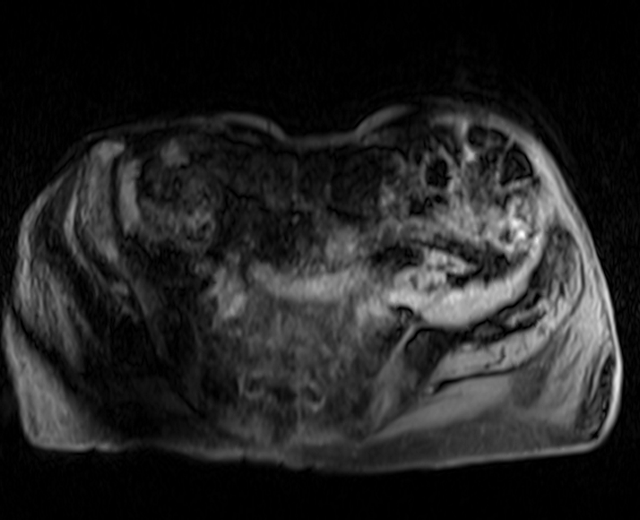

[Series 19: T1 · axial · 3.7mm · 0.55mm/px · 1 of 80 slices shown (2 of 3)]
[im 1/80]
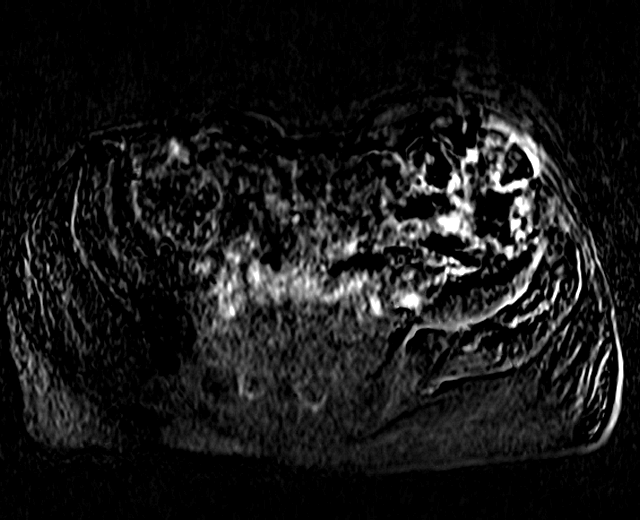

[Series 20: T1 fat-sat · coronal · 2.5mm · 1.09mm/px · 1 of 88 slices shown (4 of 4)]
[im 1/88]
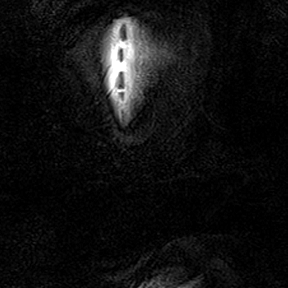

[Series 21: T2 · axial · 5.0mm · 1.27mm/px · 1 of 40 slices shown (2 of 2)]
[im 1/40]
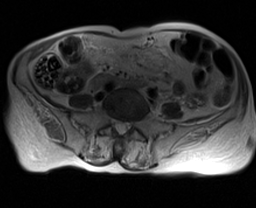

[Series 23: T1 · axial · 3.7mm · 0.55mm/px · 1 of 80 slices shown (3 of 3)]
[im 1/80]
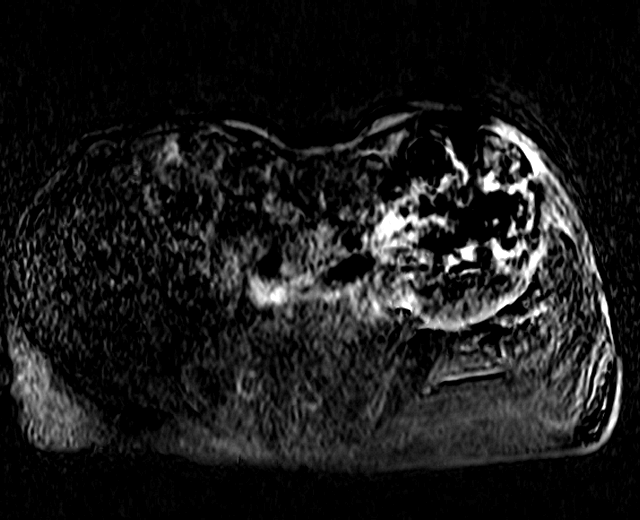

[Series 101: <mip range>_fil_1 · coronal · 0.24mm/px · 1 of 37 slices shown]
[im 1/37]
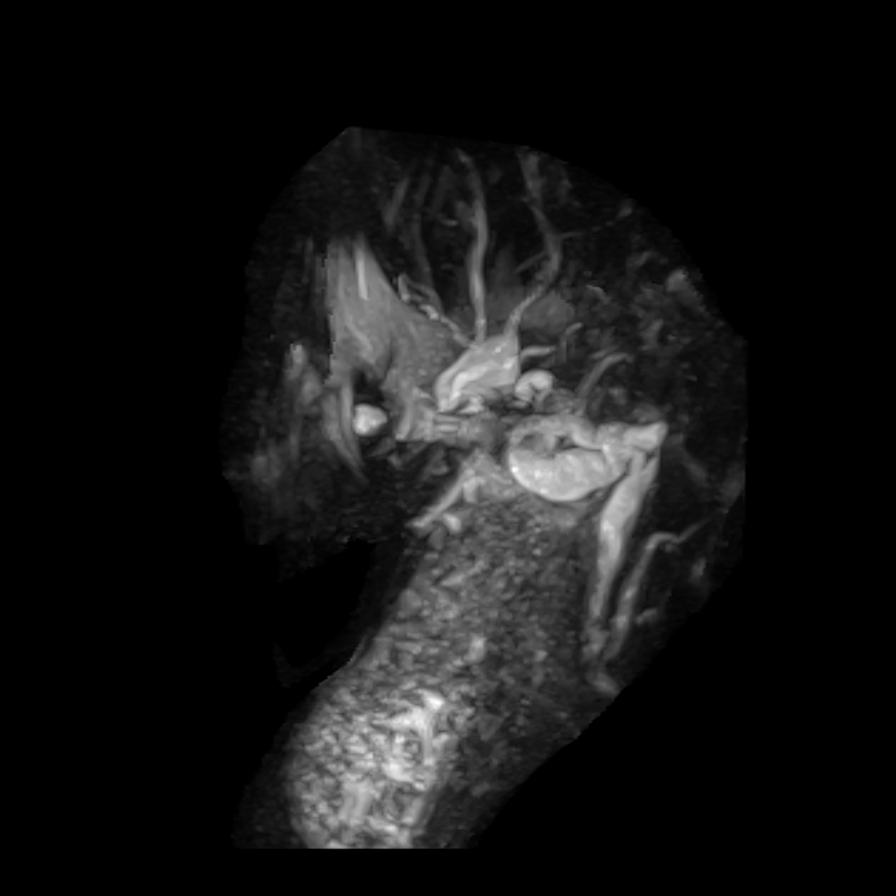

[15 of 19 positions shown; findings below may reference images not displayed]

FINDINGS: Despite efforts by the technologist and patient, moderate motion
artifact is present on today's exam and could not be eliminated.
This reduces exam sensitivity and specificity.

Lower chest: There are small bilateral pleural effusions with
dependent atelectasis at both lung bases. The heart is moderately
enlarged. No significant pericardial effusion.

Hepatobiliary: There are probable small hepatic cysts. No enhancing
liver lesions are identified. The gallbladder is filled with
multiple small gallstones. There is mild intra and extrahepatic
biliary dilatation with the common hepatic duct measuring up to 9 mm
in diameter. There are multiple small stones within the common bile
duct, best seen on series 8.

Pancreas: As seen on recent CT, there is heterogeneous enlargement
the proximal pancreatic tail with a complex peripherally enhancing
fluid collection in the lesser sac, measuring up to 6.6 x 2.7 cm
(image 65/17). No obvious pancreatic mass or pancreatic ductal
dilatation, although evaluation limited by motion.

Spleen: Normal in size without focal abnormality.

Adrenals/Urinary Tract: Both adrenal glands appear normal. No
significant abnormalities of either kidney. There is no
hydronephrosis.

Stomach/Bowel: No evidence of bowel wall thickening, distention or
surrounding inflammatory change.

Vascular/Lymphatic: There are no enlarged abdominal lymph nodes.
Aortic atherosclerosis without evidence of aneurysm.

Other: Mild generalized soft tissue edema with a small amount of
ascites. No other focal extraluminal fluid collections are seen.

Musculoskeletal: There is a convex right thoracolumbar scoliosis
associated with multiple compression deformities involving the T11,
L1 and L3 vertebral bodies, similar to recent CT. Inferior endplate
edema and enhancement are present on the left in the L4 and L5
vertebral bodies. There is corresponding sclerosis in the L5
vertebral body on recent CT, but no gross lytic lesion.
IMPRESSION: 1. Cholelithiasis with choledocholithiasis and mild biliary
dilatation.
2. Persistent heterogeneous enlargement of the proximal pancreatic
tail with complex peripherally enhancing fluid collection in the
lesser sac, similar to recent CT, and most consistent with
pancreatitis. Patient has corresponding elevated serum lipase
levels. These pancreatic findings are not well seen by this
examination, in part due to motion artifact. CT follow-up
recommended to exclude neoplasm.
3. Mild generalized soft tissue edema and small bilateral pleural
effusions with bibasilar atelectasis.
4. Grossly stable thoracolumbar compression deformities.
Indeterminate inferior endplate edema and enhancement in the L4 and
L5 vertebral bodies.

## 2022-08-12 NOTE — Telephone Encounter (Signed)
Erroneous encounter will close.
